# Patient Record
Sex: Female | Born: 2000 | Race: Black or African American | Hispanic: No | Marital: Single | Smoking: Never smoker
Health system: Southern US, Community
[De-identification: ages and names within clinical notes are randomized; demographics above are authoritative.]

## PROBLEM LIST (undated history)

## (undated) HISTORY — PX: CLOSED REDUCTION WRIST FRACTURE: SHX1091

---
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
## 2022-10-16 ENCOUNTER — Encounter: Payer: Self-pay | Admitting: Sports Medicine

## 2022-10-16 NOTE — Progress Notes (Signed)
  [employer] Training Room Note  History of Present Illness:   Helen [employer] is a 22 y.o. female who presents for depression. She is a Nature conservation officer.  She has dealt with depressive thoughts as well as some anxiety for the last few years.  Noticed this to her incoming freshman year at [employer].  She has met with a psychologist before, but he had the start of this incoming school year she did re-establish with [employer], our sports psychologist - does note she is experiencing some depersonalization. Helen [employer] is reporting depression with more anhedonia, low energy and lack of interest.  Does deal with some anxiety as well.  Her appetite has been stable.  Her sleep will vary, she denies any daytime naps.  She has never been on medication in the past but is open to this.  She denies any SI/HI.  Also reporting some midline stomach pain, concern for possible hernia.  Has noticed it for the last year.  No bulging, she does have episodes of constipation weekly.  At times she will have mild blood in the stool.  Sometimes stool will be firm, tries to avoid straining if possible.  Notable family history: -Mother with depression -Sister with schizophrenia  Assessment and Plan:   Depression Anxiety   Has bothered her on and off for years but fluctuates, here at the start of this year symptoms have been worsening.  She is following with psychology for CBT/counseling.  We discussed social and lifestyle intervention.  I did discuss medication options which would include but not limited to Lexapro, Celexa, Zoloft.  She will read and do some research on these.  I would like to bring her into the office formally for a PHQ-9 and GAD-7 for true diagnosis and severity.  At that visit if she would like to proceed with a trial of pharmacologic management we could consider.  3. Abdominal pain with constipation -she was concerned about possible hernia, but I do not appreciate any hernia noted on exam.  Discussed appropriate  dietary modifications.  Would recommend magnesium 400mg   daily. Recommend following with [employer] dietician to discuss dietary options. Recommended core stability rehab. Will f/u in about 1 month to re-evaluate. Could consider GI referral.  Comprehensive Musculoskeletal Exam:    Gen: Well-appearing, in no acute distress Pysch: Alert, oriented, appropriate emotional affect GI: There is some mild TTP in the midline of the stomach superior to the umbilicus.  Abdomen is soft without rigidity, no rebound or guarding.  Imaging:    N/a  Madelyn Brunner, DO Primary Care Sports Medicine - [facility] Team Physician [facility]  This document was dictated using Dragon voice recognition software. A reasonable attempt at proof reading has been made to minimize errors.

## 2022-10-28 ENCOUNTER — Encounter: Payer: Self-pay | Admitting: Sports Medicine

## 2022-10-28 ENCOUNTER — Ambulatory Visit (INDEPENDENT_AMBULATORY_CARE_PROVIDER_SITE_OTHER): Payer: BC Managed Care – PPO | Admitting: Sports Medicine

## 2022-10-28 VITALS — BP 119/78 | HR 73

## 2022-10-28 DIAGNOSIS — F322 Major depressive disorder, single episode, severe without psychotic features: Secondary | ICD-10-CM | POA: Diagnosis not present

## 2022-10-28 DIAGNOSIS — F411 Generalized anxiety disorder: Secondary | ICD-10-CM | POA: Diagnosis not present

## 2022-10-28 MED ORDER — ESCITALOPRAM OXALATE 10 MG PO TABS: 10.0000 mg | ORAL_TABLET | Freq: Every day | ORAL | 0 refills | Status: DC

## 2022-10-28 NOTE — Progress Notes (Signed)
Helen Howe - 22 y.o. female MRN [id number]  Date of birth: 2001/01/05  Office Visit Note: Visit Date: 10/28/2022 PCP: [facility] Referred by: Pa, [facility]*  Subjective: Chief Complaint  Patient presents with   Mood consult   HPI: Helen Howe is a pleasant 22 y.o. female who presents today for evaluation of depression and anxiety.  She is a Chief Technology Officer.  She has dealt with depressive thoughts as well as generalized anxiety for the last few years.  This worsened since her incoming freshman year at [employer].  In the past she had seen her sports psychologist, Helen [employer], recently has reestablished with her.  She is seeing her weekly at this time.  Currently working on charting her feelings, emotions, situations.  She has had passive SI thoughts in the past, but never had any sort of plan or desire to act out on these.  She skips breakfast which is typical for her, otherwise her appetite has been stable.  Her sleep varies, has difficulty falling asleep and staying asleep, this will wax and wane with how her anxiety and depression is doing.  Never been on medication before.  Her mother has been diagnosed with depression as well and she is managed on Lexapro.  Notable family history: -Mother with depression -Sister with schizophrenia  Questionnaire scoring (see scanned in sheet) - PHQ-9 scoring: 20 (sev) - GAD-7 scoring: 19 (sev)  Pertinent ROS were reviewed with the patient and found to be negative unless otherwise specified above in HPI.   Assessment & Plan: Visit Diagnoses:  1. Current severe episode of major depressive disorder without psychotic features, unspecified whether recurrent (HCC)   2. Generalized anxiety disorder    Plan: Discussed with [employer] she is dealing with a recent exacerbation of her chronic underlying depression and generalized anxiety disorder.  She has had issues with this for a few years but has been worsening with  returning to school.  She is following with our sports psychologist, I would like her to continue these appointments weekly and working on lifestyle interventions to help balance her mood.  Given her severity, we mutually agreed to move forward with pharmacological management.  Will start her on Lexapro 10 mg to be taken once daily.  Reviewed R/B/I.  She knows if she has any thoughts of SI/HI, she is not to notify myself, her athletic trainers or [place] immediately.  I would like to see her in the training room in about 1 month to reevaluate how things are doing.  She will continue her counseling until that time.  Follow-up: Return in about 1 month (around 11/27/2022) for in training room [employer].   Meds & Orders:  Meds ordered this encounter  Medications   escitalopram (LEXAPRO) 10 MG tablet    Sig: Take 1 tablet (10 mg total) by mouth daily.    Dispense:  60 tablet    Refill:  0   No orders of the defined types were placed in this encounter.    Procedures: No procedures performed      Clinical History: No specialty comments available.  She reports that she has never smoked. She has never used smokeless tobacco. No results for input(s): "HGBA1C", "LABURIC" in the last 8760 hours.  Objective:   Vital Signs: BP 119/78 (BP Location: Right Arm, Patient Position: Sitting)   Pulse 73   Physical Exam  Gen: Well-appearing, in no acute distress; non-toxic CV: Regular Rate. Well-perfused. Warm. Normal S1, S2. Resp: Breathing  unlabored on room air; no wheezing. Psych: Fluid speech in conversation; appropriate affect; normal thought process Neuro: Sensation intact throughout. No gross coordination deficits.   Imaging: No results found.  Past Medical/Family/Surgical/Social History: Medications & Allergies reviewed per EMR, new medications updated. There are no problems to display for this patient.  No past medical history on file. Family History  Problem Relation Age of Onset   Depression  Mother    Schizophrenia Sister    Past Surgical History:  Procedure Laterality Date   CAST APPLICATION Right 05/30/2015   Procedure: CAST APPLICATION;  Surgeon: Erin Sons, MD;  Location: [facility] ORS;  Service: Orthopedics;  Laterality: Right;   CLOSED REDUCTION WRIST FRACTURE Right 05/30/2015   Procedure: CLOSED REDUCTION WRIST / MANIPULATION;  Surgeon: Erin Sons, MD;  Location: [facility] ORS;  Service: Orthopedics;  Laterality: Right;   CLOSED REDUCTION WRIST FRACTURE     Social History   Occupational History   Not on file  Tobacco Use   Smoking status: Never   Smokeless tobacco: Never  Substance and Sexual Activity   Alcohol use: No   Drug use: No   Sexual activity: Not on file

## 2022-12-24 ENCOUNTER — Other Ambulatory Visit: Payer: Self-pay | Admitting: Sports Medicine

## 2022-12-24 DIAGNOSIS — F411 Generalized anxiety disorder: Secondary | ICD-10-CM

## 2022-12-24 DIAGNOSIS — F322 Major depressive disorder, single episode, severe without psychotic features: Secondary | ICD-10-CM

## 2022-12-25 ENCOUNTER — Encounter: Payer: Self-pay | Admitting: Sports Medicine

## 2022-12-25 ENCOUNTER — Other Ambulatory Visit: Payer: Self-pay | Admitting: Sports Medicine

## 2022-12-25 DIAGNOSIS — F411 Generalized anxiety disorder: Secondary | ICD-10-CM

## 2022-12-25 DIAGNOSIS — F322 Major depressive disorder, single episode, severe without psychotic features: Secondary | ICD-10-CM

## 2022-12-25 MED ORDER — ESCITALOPRAM OXALATE 20 MG PO TABS: 20.0000 mg | ORAL_TABLET | Freq: Every day | ORAL | 0 refills | Status: DC

## 2022-12-25 NOTE — Progress Notes (Signed)
  [employer] Training Room Note  History of Present Illness:   Helen Howe is a 22 y.o. female who is following up for depression and anxiety.  She is a Chief Technology Officer.  Last seen back in September when we started her on Lexapro 10 mg.  She did pick this up and begin this around the end of September.  She has been taking this once daily and has not noted much change at this time.  She was supposed to meet with our sports psychologist, Sharlotte Alamo but she has not had any appointments with her since our last appointment.  On average she feels like her mood is a 4/10.  She has had history of passive SI thoughts in the past as well as current.  About 1-2 weeks ago she did have thoughts of jumping out of her window and was considering doing so, but her boyfriend stopped her from this.  She does feel safe at this time at home, does not desire hospitalization currently.    Assessment and Plan:   MDD, severe Generalized anxiety disorder  Both of the above are not at goal.  Did have an SI occurrence in the past week.  She unfortunately has not been following up with our sports psychologist, but is agreeable to do so.  She will check in with her ATC, Siobhan tomorrow, to see how she is doing and confirm f/u appt is set with Candise Bowens early next week (did discuss on phone with her today). Described the serious nature of consistent care and f/u. We will increase Lexapro from 10 mg --> 20mg  every day. Next Rx sent today.  She will follow-up with me about 1 month from increasing her Lexapro dose.  Comprehensive Musculoskeletal Exam:    Gen: Well-appearing, in no acute distress Psych: Slightly blunted affect; appropriate in conversation  Imaging:    N/a  Madelyn Brunner, DO Primary Care Sports Medicine - [facility] Team Physician [facility]  This document was dictated using Dragon voice recognition software. A reasonable attempt at proof reading has been made to minimize errors.

## 2023-02-24 ENCOUNTER — Other Ambulatory Visit: Payer: Self-pay | Admitting: Sports Medicine

## 2023-02-24 DIAGNOSIS — F411 Generalized anxiety disorder: Secondary | ICD-10-CM

## 2023-02-24 DIAGNOSIS — F322 Major depressive disorder, single episode, severe without psychotic features: Secondary | ICD-10-CM

## 2023-02-26 ENCOUNTER — Encounter: Payer: Self-pay | Admitting: Sports Medicine

## 2023-02-26 ENCOUNTER — Other Ambulatory Visit: Payer: Self-pay | Admitting: Sports Medicine

## 2023-02-26 DIAGNOSIS — F411 Generalized anxiety disorder: Secondary | ICD-10-CM

## 2023-02-26 DIAGNOSIS — F322 Major depressive disorder, single episode, severe without psychotic features: Secondary | ICD-10-CM

## 2023-02-26 MED ORDER — ESCITALOPRAM OXALATE 20 MG PO TABS: 20.0000 mg | ORAL_TABLET | Freq: Every day | ORAL | 1 refills | Status: DC

## 2023-02-26 NOTE — Progress Notes (Signed)
  [employer] Training Room Note  History of Present Illness:   Helen Howe is a 23 y.o. female who presents for f/u of MDD and GAD. She is a Chief Technology Officer.  Lucca reports her mood has been much better.  Feels like her depression and anxiety is much better controlled.  She was at home over break which was helpful.  She is currently not draining but is working to get back into training and other physical activity.  She has been on Lexapro 20 mg daily and finds this to be very beneficial.  She is following up with Sharlotte Alamo with a plan to meet once every 2 weeks for counseling.  No SI/HI.  Assessment and Plan:   MDD - severe in nature but improving and controlled. Continue Lexapro 20mg  every day and counseling q 2 weeks. GAD - controlled. Keep above plan.  - Refill Lexapro today Comprehensive Musculoskeletal Exam:    Gen: Well-appearing, in no acute distress Psych: Improved affect; positive demeanor; appropriate in conversation  Imaging:    N/a  Madelyn Brunner, DO Primary Care Sports Medicine - [facility] Team Physician [facility]  This document was dictated using Dragon voice recognition software. A reasonable attempt at proof reading has been made to minimize errors.

## 2023-04-14 ENCOUNTER — Other Ambulatory Visit: Payer: Self-pay | Admitting: Sports Medicine

## 2023-04-14 DIAGNOSIS — F411 Generalized anxiety disorder: Secondary | ICD-10-CM

## 2023-04-14 DIAGNOSIS — F322 Major depressive disorder, single episode, severe without psychotic features: Secondary | ICD-10-CM

## 2023-04-14 MED ORDER — ESCITALOPRAM OXALATE 20 MG PO TABS: 20.0000 mg | ORAL_TABLET | Freq: Every day | ORAL | 1 refills | Status: DC

## 2023-04-27 ENCOUNTER — Inpatient Hospital Stay (HOSPITAL_COMMUNITY)
Admission: AD | Admit: 2023-04-27 | Discharge: 2023-05-01 | DRG: 885 | Disposition: A | Discharge status: Home / Self Care | Source: Intra-hospital | Attending: Psychiatry | Admitting: Psychiatry

## 2023-04-27 ENCOUNTER — Ambulatory Visit (HOSPITAL_COMMUNITY)
Admission: EM | Admit: 2023-04-27 | Discharge: 2023-04-27 | Disposition: A | Discharge status: Other Acute Inpatient Hospital | Attending: Psychiatry | Admitting: Psychiatry

## 2023-04-27 DIAGNOSIS — F410 Panic disorder [episodic paroxysmal anxiety] without agoraphobia: Secondary | ICD-10-CM | POA: Insufficient documentation

## 2023-04-27 DIAGNOSIS — Z3202 Encounter for pregnancy test, result negative: Secondary | ICD-10-CM | POA: Diagnosis not present

## 2023-04-27 DIAGNOSIS — F332 Major depressive disorder, recurrent severe without psychotic features: Principal | ICD-10-CM | POA: Diagnosis present

## 2023-04-27 DIAGNOSIS — G4700 Insomnia, unspecified: Secondary | ICD-10-CM | POA: Diagnosis present

## 2023-04-27 DIAGNOSIS — R45851 Suicidal ideations: Secondary | ICD-10-CM | POA: Diagnosis present

## 2023-04-27 DIAGNOSIS — Z818 Family history of other mental and behavioral disorders: Secondary | ICD-10-CM | POA: Diagnosis not present

## 2023-04-27 DIAGNOSIS — Z79899 Other long term (current) drug therapy: Secondary | ICD-10-CM | POA: Diagnosis not present

## 2023-04-27 DIAGNOSIS — Z560 Unemployment, unspecified: Secondary | ICD-10-CM | POA: Diagnosis not present

## 2023-04-27 DIAGNOSIS — F411 Generalized anxiety disorder: Secondary | ICD-10-CM | POA: Diagnosis present

## 2023-04-27 DIAGNOSIS — G4710 Hypersomnia, unspecified: Secondary | ICD-10-CM | POA: Diagnosis present

## 2023-04-27 DIAGNOSIS — Z9151 Personal history of suicidal behavior: Secondary | ICD-10-CM | POA: Diagnosis not present

## 2023-04-27 LAB — POCT URINE DRUG SCREEN - MANUAL ENTRY (I-SCREEN)
POC Amphetamine UR: NOT DETECTED
POC Buprenorphine (BUP): NOT DETECTED
POC Cocaine UR: NOT DETECTED
POC Marijuana UR: POSITIVE — AB
POC Methadone UR: NOT DETECTED
POC Methamphetamine UR: NOT DETECTED
POC Morphine: NOT DETECTED
POC Oxazepam (BZO): NOT DETECTED
POC Oxycodone UR: NOT DETECTED
POC Secobarbital (BAR): NOT DETECTED

## 2023-04-27 LAB — CBC WITH DIFFERENTIAL/PLATELET
Abs Immature Granulocytes: 0.03 10*3/uL (ref 0.00–0.07)
Basophils Absolute: 0.1 10*3/uL (ref 0.0–0.1)
Basophils Relative: 1 %
Eosinophils Absolute: 0.3 10*3/uL (ref 0.0–0.5)
Eosinophils Relative: 3 %
HCT: 38.9 % (ref 36.0–46.0)
Hemoglobin: 12.6 g/dL (ref 12.0–15.0)
Immature Granulocytes: 0 %
Lymphocytes Relative: 22 %
Lymphs Abs: 1.6 10*3/uL (ref 0.7–4.0)
MCH: 26.9 pg (ref 26.0–34.0)
MCHC: 32.4 g/dL (ref 30.0–36.0)
MCV: 83.1 fL (ref 80.0–100.0)
Monocytes Absolute: 0.4 10*3/uL (ref 0.1–1.0)
Monocytes Relative: 5 %
Neutro Abs: 5.1 10*3/uL (ref 1.7–7.7)
Neutrophils Relative %: 69 %
Platelets: 292 10*3/uL (ref 150–400)
RBC: 4.68 MIL/uL (ref 3.87–5.11)
RDW: 12.2 % (ref 11.5–15.5)
WBC: 7.4 10*3/uL (ref 4.0–10.5)
nRBC: 0 % (ref 0.0–0.2)

## 2023-04-27 LAB — COMPREHENSIVE METABOLIC PANEL
ALT: 17 U/L (ref 0–44)
AST: 23 U/L (ref 15–41)
Albumin: 3.8 g/dL (ref 3.5–5.0)
Alkaline Phosphatase: 24 U/L — ABNORMAL LOW (ref 38–126)
Anion gap: 9 (ref 5–15)
BUN: 13 mg/dL (ref 6–20)
CO2: 22 mmol/L (ref 22–32)
Calcium: 9.3 mg/dL (ref 8.9–10.3)
Chloride: 106 mmol/L (ref 98–111)
Creatinine, Ser: 0.89 mg/dL (ref 0.44–1.00)
GFR, Estimated: >60 mL/min (ref >60)
Glucose, Bld: 87 mg/dL (ref 70–99)
Potassium: 4.2 mmol/L (ref 3.5–5.1)
Sodium: 137 mmol/L (ref 135–145)
Total Bilirubin: 0.6 mg/dL (ref 0.0–1.2)
Total Protein: 7.1 g/dL (ref 6.5–8.1)

## 2023-04-27 LAB — LIPID PANEL
Cholesterol: 142 mg/dL (ref 0–200)
HDL: 55 mg/dL (ref >40)
LDL Cholesterol: 77 mg/dL (ref 0–99)
Total CHOL/HDL Ratio: 2.6 ratio
Triglycerides: 50 mg/dL (ref <150)
VLDL: 10 mg/dL (ref 0–40)

## 2023-04-27 LAB — HEMOGLOBIN A1C
Hgb A1c MFr Bld: 5.2 % (ref 4.8–5.6)
Mean Plasma Glucose: 102.54 mg/dL

## 2023-04-27 LAB — ETHANOL: Alcohol, Ethyl (B): <10 mg/dL (ref <10)

## 2023-04-27 MED ORDER — DIPHENHYDRAMINE HCL 50 MG PO CAPS: 50.0000 mg | ORAL_CAPSULE | Freq: Three times a day (TID) | ORAL | Status: DC | PRN

## 2023-04-27 MED ORDER — TRAZODONE HCL 50 MG PO TABS
50.0000 mg | ORAL_TABLET | Freq: Every evening | ORAL | Status: DC | PRN
  Administered 2023-04-27 – 2023-04-30 (×4): 50 mg via ORAL
  Filled 2023-04-27 (×5): qty 1

## 2023-04-27 MED ORDER — HALOPERIDOL 5 MG PO TABS: 5.0000 mg | ORAL_TABLET | Freq: Three times a day (TID) | ORAL | Status: DC | PRN

## 2023-04-27 MED ORDER — DIPHENHYDRAMINE HCL 25 MG PO CAPS: 50.0000 mg | ORAL_CAPSULE | Freq: Three times a day (TID) | ORAL | Status: DC | PRN

## 2023-04-27 MED ORDER — MAGNESIUM HYDROXIDE 400 MG/5ML PO SUSP: 30.0000 mL | Freq: Every day | ORAL | Status: DC | PRN

## 2023-04-27 MED ORDER — ESCITALOPRAM OXALATE 10 MG PO TABS
20.0000 mg | ORAL_TABLET | Freq: Every day | ORAL | Status: DC
Start: 2023-04-27 — End: 2023-04-27
  Administered 2023-04-27: 20 mg via ORAL
  Filled 2023-04-27: qty 2

## 2023-04-27 MED ORDER — BUPROPION HCL ER (XL) 150 MG PO TB24: 150.0000 mg | ORAL_TABLET | Freq: Every day | ORAL | Status: DC

## 2023-04-27 MED ORDER — ACETAMINOPHEN 325 MG PO TABS
650.0000 mg | ORAL_TABLET | Freq: Four times a day (QID) | ORAL | Status: DC | PRN
  Administered 2023-04-28: 650 mg via ORAL
  Filled 2023-04-27: qty 2

## 2023-04-27 MED ORDER — HYDROXYZINE HCL 25 MG PO TABS
25.0000 mg | ORAL_TABLET | Freq: Three times a day (TID) | ORAL | Status: DC | PRN
  Administered 2023-04-27 – 2023-04-30 (×4): 25 mg via ORAL
  Filled 2023-04-27 (×5): qty 1

## 2023-04-27 MED ORDER — ACETAMINOPHEN 325 MG PO TABS: 650.0000 mg | ORAL_TABLET | Freq: Four times a day (QID) | ORAL | Status: DC | PRN

## 2023-04-27 MED ORDER — ESCITALOPRAM OXALATE 20 MG PO TABS
20.0000 mg | ORAL_TABLET | Freq: Every day | ORAL | Status: DC
Start: 2023-04-28 — End: 2023-04-28
  Administered 2023-04-28: 20 mg via ORAL
  Filled 2023-04-27 (×2): qty 1

## 2023-04-27 MED ORDER — TRAZODONE HCL 50 MG PO TABS: 50.0000 mg | ORAL_TABLET | Freq: Every evening | ORAL | Status: DC | PRN

## 2023-04-27 MED ORDER — BUPROPION HCL ER (XL) 150 MG PO TB24
150.0000 mg | ORAL_TABLET | Freq: Every day | ORAL | Status: DC
  Administered 2023-04-28 – 2023-04-29 (×2): 150 mg via ORAL
  Filled 2023-04-27 (×3): qty 1

## 2023-04-27 MED ORDER — HYDROXYZINE HCL 25 MG PO TABS: 25.0000 mg | ORAL_TABLET | Freq: Three times a day (TID) | ORAL | Status: DC | PRN

## 2023-04-27 MED ORDER — ALUM & MAG HYDROXIDE-SIMETH 200-200-20 MG/5ML PO SUSP: 30.0000 mL | ORAL | Status: DC | PRN

## 2023-04-27 NOTE — Discharge Instructions (Signed)
 [facility]

## 2023-04-27 NOTE — ED Notes (Signed)
 Patient admitted to Adult Observation for worsening depression and passive suicidal ideation. When asked, pt endorses SI with no plan, denies HI and AVH. She denies physical pain or discomfort. Pt's affect is depressed, mood congruent. Pt's speech is soft. Skin check conducted by this RN and Adela Lank, MHT. Patient oriented to the unit. Dinner and beverage provided. No additional needs at this time. We will continue to monitor for safety.

## 2023-04-27 NOTE — ED Notes (Signed)
 Patient denies pain and is resting comfortably.

## 2023-04-27 NOTE — ED Notes (Signed)
 Report given to [facility] RN@[facility]  adult unit

## 2023-04-27 NOTE — ED Notes (Signed)
 Pt is calm and cooperative very withdrawn, quiet she is depressed and is ok with being transferred to [facility] for inpatient denies SI/HI/AVH no pain or distress noted will continue to monitor for safety

## 2023-04-27 NOTE — ED Provider Notes (Signed)
 [facility] Continuous Assessment Admission H&P  Date: 04/27/23 Patient Name: Helen Howe MRN: [id number] Chief Complaint: "I think it's easier to not be here."   Diagnoses:  Final diagnoses:  Severe episode of recurrent major depressive disorder, without psychotic features (HCC)  Generalized anxiety disorder    HPI: Helen Howe is a 23 yo female with a past psychiatric history of MDD on lexapro, history of 1 suicide attempt, no history of inpatient psychiatric hospitalizations and no pertinent past medical history who presents to the [facility] with her mother for increased passive suicidal ideations.  Patient is seen in the assessment room with mom Pattricia Boss) and [facility] Fal[facility]. Patient is observed to be clinging to her mom and curled up on the seat in the room. She speaks softly. She is unable to identify a trigger for her current increased suicidal ideation. Mom reports that patient told her that she was at her breaking point and thinks it's easier to not be here. She reports that this morning patient told her about the suicide attempt. Patient is vague about reference to a plan. She denies writing a suicide note or making action towards suicide. She reports she self-harmed yesterday with a knife on her wrist due to feeling overwhelmed. Patient confirms that she has been feeling increased hopelessness, suicidal ideation, guilt, decreased energy, increased sleep, decreased appetite, decreased concentration. Mom reports that patient has not been taking care of herself, reports the house has been dirty. She reports that patient normally likes running track and field and stopped last year. She has been having difficulties at school, is a Technical sales engineer at [employer]. She reports she attempted suicide a couple of months ago. Per chart review, patient had thoughts of jumping out her window in 12/2022. She reports she tried to jump from the window on the second floor but her boyfriend stopped  her. Patient also reports increased generalized anxiety. Patient denies history of trauma. She denies AVH. Patient reports that she has been prescribed lexapro 20mg  and has been compliant with her medication. She denies substance use.   Patient has a past psychiatric history of depression and anxiety. She has been followed up in the outpatient setting by Sharlotte Alamo a sports psychologist as well as Dr. Shon Baton for medication management. She is currently taking lexapro 20mg . She has not had other past medication trials. She has no prior inpatient psychiatric hospitalizations. She had the prior suicide attempt in 12/2022 when she tried to jump out of her window but her boyfriend stopped her.   Patient has no medical history or allergies. She has no history of seizures or head trauma. She has no known medication allergies.   Patient has a family history of mother with seasonal depression, older sister with schizophrenia, and maternal uncles with schizophrenia.   Patient lives in an apartment with her boyfriend of 1 year. She is currently studying sociology at [employer]. Her grades "could be better." She used to run track and field but stopped last year. She is not currently employed. She is from [place]. She does not have access to guns. She denies legal issues. Does not identify religion as an important part of her life.   Discussed restarting home lexapro 20mg  for her depression. Discussed starting wellbutrin to augment her antidepressant. Discussed r/b/se. Patient and mom were in agreement for trial of wellbutrin. Discussed recommendation for inpatient psychiatric hospitalization.   Total Time spent with patient: 30 minutes  Musculoskeletal  Strength & Muscle Tone: within normal limits Gait &  Station: normal Patient leans: N/A  Psychiatric Specialty Exam  Presentation General Appearance: Casual  Eye Contact:Poor  Speech:-- (soft)  Speech Volume:Decreased  Handedness:No data  recorded  Mood and Affect  Mood:-- (think it's easier to not be here)  Affect:Tearful; Flat   Thought Process  Thought Processes:Coherent  Descriptions of Associations:Intact  Orientation:Full (Time, Place and Person)  Thought Content:WDL    Hallucinations:Hallucinations: None  Ideas of Reference:None  Suicidal Thoughts:Suicidal Thoughts: Yes, Passive  Homicidal Thoughts:Homicidal Thoughts: No   Sensorium  Memory:Immediate Fair  Judgment:Impaired  Insight:Lacking   Executive Functions  Concentration:Fair  Attention Span:Fair  Recall:Fair  Fund of Knowledge:Fair  Language:Fair   Psychomotor Activity  Psychomotor Activity:Psychomotor Activity: Normal   Assets  Assets:Communication Skills; Desire for Improvement; Housing; Resilience; Intimacy   Sleep  Sleep:Sleep: Good   Nutritional Assessment (For OBS and [facility]BC admissions only) Has the patient had a weight loss or gain of 10 pounds or more in the last 3 months?: No Has the patient had a decrease in food intake/or appetite?: No Does the patient have dental problems?: No Does the patient have eating habits or behaviors that may be indicators of an eating disorder including binging or inducing vomiting?: No Has the patient recently lost weight without trying?: 0 Has the patient been eating poorly because of a decreased appetite?: 0 Malnutrition Screening Tool Score: 0    Physical Exam ROS  Physical Exam Constitutional:      Appearance: the patient is not toxic-appearing.  Pulmonary:     Effort: Pulmonary effort is normal.  Neurological:     General: No focal deficit present.     Mental Status: the patient is alert and oriented to person, place, and time.   Review of Systems  Respiratory:  Negative for shortness of breath.   Cardiovascular:  Negative for chest pain.  Gastrointestinal:  Negative for abdominal pain, constipation, diarrhea, nausea and vomiting.  Neurological:  Negative for  headaches.    Blood pressure 127/89, pulse 71, temperature 98.3 F (36.8 C), temperature source Oral, resp. rate 16, SpO2 100%. There is no height or weight on file to calculate BMI.  Past Psychiatric History:  Patient has a past psychiatric history of depression and anxiety. She has been followed up in the outpatient setting by Sharlotte Alamo a sports psychologist as well as Dr. Shon Baton for medication management. She is currently taking lexapro 20mg . She has not had other past medication trials. She has no prior inpatient psychiatric hospitalizations. She had the prior suicide attempt in 12/2022 when she tried to jump out of her window but her boyfriend stopped her.    Is the patient at risk to self? Yes  Has the patient been a risk to self in the past 6 months? Yes .    Has the patient been a risk to self within the distant past? No   Is the patient a risk to others? No   Has the patient been a risk to others in the past 6 months? No   Has the patient been a risk to others within the distant past? No   Past Medical History: none  Family History: Patient has a family history of mother with seasonal depression, older sister with schizophrenia, and maternal uncles with schizophrenia.   Social History: Patient lives in an apartment with her boyfriend of 1 year. She is currently studying sociology at [employer]. Her grades "could be better." She used to run track and field but stopped last year. She is not  currently employed. She is from [place]. She does not have access to guns. She denies legal issues. Does not identify religion as an important part of her life.   Last Labs:  No visits with results within 6 Month(s) from this visit.  Latest known visit with results is:  Lab on 02/09/2019  Component Date Value Ref Range Status   SARS-CoV-2, NAA 02/09/2019 Not Detected  Not Detected Final   Comment: This nucleic acid amplification test was developed and its performance characteristics determined  by [employer]. Nucleic acid amplification tests include PCR and TMA. This test has not been FDA cleared or approved. This test has been authorized by FDA under an Emergency Use Authorization (EUA). This test is only authorized for the duration of time the declaration that circumstances exist justifying the authorization of the emergency use of in vitro diagnostic tests for detection of SARS-CoV-2 virus and/or diagnosis of COVID-19 infection under section 564(b)(1) of the Act, 21 U.S.C. 161WRU-0(A) (1), unless the authorization is terminated or revoked sooner. When diagnostic testing is negative, the possibility of a false negative result should be considered in the context of a patient's recent exposures and the presence of clinical signs and symptoms consistent with COVID-19. An individual without symptoms of COVID-19 and who is not shedding SARS-CoV-2 virus would                           expect to have a negative (not detected) result in this assay.     Allergies: Patient has no known allergies.  Medications:  Facility Ordered Medications  Medication   acetaminophen (TYLENOL) tablet 650 mg   alum & mag hydroxide-simeth (MAALOX/MYLANTA) 200-200-20 MG/5ML suspension 30 mL   magnesium hydroxide (MILK OF MAGNESIA) suspension 30 mL   haloperidol (HALDOL) tablet 5 mg   And   diphenhydrAMINE (BENADRYL) capsule 50 mg   hydrOXYzine (ATARAX) tablet 25 mg   traZODone (DESYREL) tablet 50 mg   escitalopram (LEXAPRO) tablet 20 mg   [START ON 04/28/2023] buPROPion (WELLBUTRIN XL) 24 hr tablet 150 mg   PTA Medications  Medication Sig   predniSONE (STERAPRED UNI-PAK 21 TAB) 10 MG (21) TBPK tablet Use as directed   benzonatate (TESSALON PERLES) 100 MG capsule Take 1 capsule (100 mg total) by mouth 3 (three) times daily as needed.   escitalopram (LEXAPRO) 20 MG tablet Take 1 tablet (20 mg total) by mouth daily.    Medical Decision Making  Cathy Crounse is a 23 yo female with a past  psychiatric history of MDD on lexapro, history of 1 suicide attempt, no history of inpatient psychiatric hospitalizations and no pertinent past medical history who presents to the [facility] with her mother for increased passive suicidal ideations. Patient appears child-like, guarded, unable to identify triggers and has had increased depressive symptoms in the setting of being followed up with outpatient therapy. Patient has also had recent self-harm (yesterday) and recent suicide attempt. Note from outpatient medication management provider in 12/2022 mentions that patient had SA and had thought about hospitalization "She has had history of passive SI thoughts in the past as well as current.  About 1-2 weeks ago she did have thoughts of jumping out of her window and was considering doing so, but her boyfriend stopped her from this.  She does feel safe at this time at home, does not desire hospitalization currently."   Given patient's presentation at this time, we will recommend inpatient psychiatric hospitalization. We will restart  patient's home lexapro and plan to augment with wellbutrin given her reported hypersomnia.   #MDD, recurrent, severe -restart home lexapro 20mg  for depression -start wellbutrin XL 150mg  (tomorrow AM) for depression  Meds ordered this encounter  Medications   acetaminophen (TYLENOL) tablet 650 mg   alum & mag hydroxide-simeth (MAALOX/MYLANTA) 200-200-20 MG/5ML suspension 30 mL   magnesium hydroxide (MILK OF MAGNESIA) suspension 30 mL   AND Linked Order Group    haloperidol (HALDOL) tablet 5 mg    diphenhydrAMINE (BENADRYL) capsule 50 mg   hydrOXYzine (ATARAX) tablet 25 mg   traZODone (DESYREL) tablet 50 mg   escitalopram (LEXAPRO) tablet 20 mg   buPROPion (WELLBUTRIN XL) 24 hr tablet 150 mg    Orders Placed This Encounter  Procedures   CBC with Differential/Platelet   Comprehensive metabolic panel   Hemoglobin A1c   Ethanol   Lipid panel   TSH   Diet regular Room  service appropriate? Yes; Fluid consistency: Thin   Voluntary Treatment   Vital signs   Activity as tolerated   Full code   POC urine preg, ED   POCT Urine Drug Screen - (I-Screen)   Place in continuous assessment [facility]    Dispo: recommend inpatient psychiatric hospitalization   This case was discussed with attending Dr. Enedina Finner who agrees with the above formulated treatment plan. Please see attending attestation for additional details.   Recommendations  Based on my evaluation the patient does not appear to have an emergency medical condition.  Karie Fetch, MD, PGY-2 04/27/23  4:44 PM

## 2023-04-27 NOTE — Progress Notes (Signed)
   04/27/23 1612  [facility] Triage Screening (Walk-ins at [facility] only)  How Did You Hear About [place]? Self  What Is the Reason for Your Visit/Call Today? Patient is a 23 year old that presents this date as a voluntary walk in with her mother [name] with patient voicing ongoing SI for the last 24 hours. Patient is vague in reference to a plan and renders limited history. Patient is observed to be tearful and speaks in a low soft voice that is difficult to understand. Mother is present who renders most of patient's history. Patient was diagnosed with depression in the last year and per chart review was seen on 3/4 by Madelyn Brunner DO who is prescribing Lexapro 20 mg and patient reports current compliance although states, "it isn't working." Patient reports ongoing feelings of hopelessness and worthlessness. Patient cannot identify any immediate psycho-social stressors or why her symptoms have increased in the last month. Patient denies any HI or AVH. Patient denies any SA issues. 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     40 mins [facility] [place] [address] [place]  How Long Has This Been Causing You Problems? <Week  Have You Recently Had Any Thoughts About Hurting Yourself? Yes  How long ago did you have thoughts about hurting yourself? last 24 hours  Are You Planning to Commit Suicide/Harm Yourself At This time? No  Have you Recently Had Thoughts About Hurting Someone [name]? No  Are You Planning To Harm Someone At This Time? No  Physical Abuse Denies  Verbal Abuse Denies  Sexual Abuse Denies  Exploitation of patient/patient's resources Denies  Self-Neglect Denies  Possible abuse reported to: Other (Comment) (NA)  Are you currently experiencing any auditory, visual or other hallucinations? No  Have You Used Any Alcohol or Drugs in the Past 24 Hours? No  Do you have any current medical co-morbidities that require immediate attention? No  Clinician description of patient physical  appearance/behavior: Patient is cooperative  What Do You Feel Would Help You the Most Today? Medication(s)  If access to [facility] was not available, would you have sought care in the Emergency Department? No  Determination of Need Urgent (48 hours)  Options For Referral  (to be determined)  Determination of Need filed? Yes

## 2023-04-27 NOTE — BH Assessment (Addendum)
 LCSW Social Work Progress NoteMayzee Howe  MRN: [id number]  04/27/2023 5:33 PM  Per Karie Fetch, MD, patient meets criteria for inpatient psychiatric treatment. The accepting is Dr. Rex Kras, MD [facility], Danika, RN, assigned patient to [facility], room 402-2 tonight pending labs, vol, UDS, EKG. Patient's care team provided disposition updates.

## 2023-04-27 NOTE — BH Assessment (Signed)
 Comprehensive Clinical Assessment (CCA) Note  04/27/2023 Helen Howe [id number]  Chief Complaint:  Chief Complaint  Patient presents with   Depression   Suicidal   Visit Diagnosis:  Severe episode of recurrent major depressive disorder, without psychotic features (HCC)  Generalized anxiety disorder   The patient demonstrates the following risk factors for suicide: Chronic risk factors for suicide include: psychiatric disorder of DEPRESSION . Acute risk factors for suicide include: social withdrawal/isolation. Protective factors for this patient include: positive social support, positive therapeutic relationship, responsibility to others (children, family), and hope for the future. Considering these factors, the overall suicide risk at this point appears to be high. Patient is not appropriate for outpatient follow up.     CCA Screening, Triage and Referral (STR)  Patient Reported Information How did you hear about [place]? Self  What Is the Reason for Your Visit/Call Today? Patient is a 23 year old that presents this date as a voluntary walk in with her mother Helen Howe with patient voicing ongoing SI for the last 24 hours. Patient is vague in reference to a plan and renders limited history. Patient is observed to be tearful and speaks in a low soft voice that is difficult to understand. Mother is present who renders most of patient's history. Patient was diagnosed with depression in the last year and per chart review was seen on 3/4 by Helen Brunner DO who is prescribing Lexapro 20 mg and patient reports current compliance although states, "it isn't working." Patient reports ongoing feelings of hopelessness and worthlessness. Patient cannot identify any immediate psycho-social stressors or why her symptoms have increased in the last month. Patient denies any HI or AVH. Patient denies any SA issues. 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       Per mom for the past 6-7  months patient has been having a hard time functioning at school and feeling motivated. Mom reports that patient has reported feeling overwhelmed with daily task of schoolwork. Mom reports noticing decrease in patient functioning, stating that her house was not organized, and she has not been able to participate in track and field at school. Mom states "she has no will to do anything". A couple weeks ago mom found patient in her closet on the floor and patient recently told mom that she attempted to jump out of her apartment window, but her boyfriend came in and stopped her. Patient lives on the second floor. Mom reports that patient called her this morning and told her that she could no longer do it and did not want to be alive.   Patient denies history of inpatient treatment and does not use drugs. Patient reports social alcohol use. Patient does not have access to a gun, she lives with her boyfriend of one year. Patient reports last year was a difficult year for her but denies being on academic probation or being at risk of failing school. Patient denies history of P/S/E abuse and no recent trauma. Mom is diagnosed with seasonal depression and her older sister is diagnosed with schizophrenia. Patient denies legal issues, does not have access to guns and she is not working.  Patient oriented to person, place and situation. Patient eye contact is normal, speech is soft and almost childlike. Patient reports ongoing SI no plan. Patient reports depressive symptoms of sadness, low mood and energy, crying, feeling helpless and hopeless and difficulty sleeping. Patient also reports engaging in SIB of cutting her wrist with a knife  yesterday, however there is no visible signs of cutting.   How Long Has This Been Causing You Problems? <Week  What Do You Feel Would Help You the Most Today? Medication(s)   Have You Recently Had Any Thoughts About Hurting Yourself? Yes  Are You Planning to Commit Suicide/Harm  Yourself At This time? No   Flowsheet Row ED from 04/27/2023 in [facility]  C-SSRS RISK CATEGORY Moderate Risk       Have you Recently Had Thoughts About Hurting Someone Helen Howe? No  Are You Planning to Harm Someone at This Time? No  Explanation: No data recorded  Have You Used Any Alcohol or Drugs in the Past 24 Hours? No  How Long Ago Did You Use Drugs or Alcohol? No data recorded What Did You Use and How Much? No data recorded  Do You Currently Have a Therapist/Psychiatrist? Yes  Name of Therapist/Psychiatrist: Name of Therapist/Psychiatrist: AT [employer]   Have You Been Recently Discharged From Any Office Practice or Programs? No  Explanation of Discharge From Practice/Program: NA    CCA Screening Triage Referral Assessment Type of Contact: Face-to-Face  Telemedicine Service Delivery:   Is this Initial or Reassessment?   Date Telepsych consult ordered in [facility]:    Time Telepsych consult ordered in [facility]:    Location of Assessment: [facility]  Provider Location: [facility] Assessment Services   Collateral Involvement: MOTHER   Does Patient Have a Automotive engineer Guardian? No data recorded Legal Guardian Contact Information: NA  Copy of Legal Guardianship Form: -- (NA)  Legal Guardian Notified of Arrival: -- (NA)  Legal Guardian Notified of Pending Discharge: -- (NA)  If Minor and Not Living with Parent(s), Who has Custody? NA  Is CPS involved or ever been involved? Never  Is APS involved or ever been involved? Never   Patient Determined To Be At Risk for Harm To Self or Others Based on Review of Patient Reported Information or Presenting Complaint? Yes, for Self-Harm  Method: No Plan  Availability of Means: No access or NA  Intent: Vague intent or NA  Notification Required: No need or identified person  Additional Information for Danger to Others Potential: Previous attempts  Additional Comments for Danger  to Others Potential: NA  Are There Guns or Other Weapons in Your Home? No  Types of Guns/Weapons: NA  Are These Weapons Safely Secured?                            -- (NA)  Who Could Verify You Are Able To Have These Secured: BOYFRIEND AND MOTHER  Do You Have any Outstanding Charges, Pending Court Dates, Parole/Probation? DENIES  Contacted To Inform of Risk of Harm To Self or Others: Family/Significant Other:    Does Patient Present under Involuntary Commitment? No    [place] of Residence: [place]   Patient Currently Receiving the Following Services: Individual Therapy; Medication Management   Determination of Need: Urgent (48 hours)   Options For Referral: Inpatient Hospitalization (to be determined)     CCA Biopsychosocial Patient Reported Schizophrenia/Schizoaffective Diagnosis in Past: No   Strengths: NA   Mental Health Symptoms Depression:  Change in energy/activity; Difficulty Concentrating; Fatigue; Hopelessness; Increase/decrease in appetite; Worthlessness; Tearfulness; Irritability   Duration of Depressive symptoms: Duration of Depressive Symptoms: Greater than two weeks   Mania:  None   Anxiety:   Worrying; Tension   Psychosis:  None   Duration  of Psychotic symptoms:    Trauma:  None   Obsessions:  None   Compulsions:  None   Inattention:  None   Hyperactivity/Impulsivity:  None   Oppositional/Defiant Behaviors:  None   Emotional Irregularity:  None   Other Mood/Personality Symptoms:  NA    Mental Status Exam Appearance and self-care  Stature:  Average   Weight:  Average weight   Clothing:  Age-appropriate   Grooming:  Normal   Cosmetic use:  None   Posture/gait:  Normal   Motor activity:  Slowed   Sensorium  Attention:  Normal   Concentration:  Normal   Orientation:  X5   Recall/memory:  Normal   Affect and Mood  Affect:  Depressed   Mood:  Depressed   Relating  Eye contact:  Fleeting   Facial expression:   Sad; Depressed   Attitude toward examiner:  Cooperative   Thought and Language  Speech flow: Soft   Thought content:  Appropriate to Mood and Circumstances   Preoccupation:  None   Hallucinations:  None   Organization:  Intact   [employer] of Knowledge:  Fair   Intelligence:  Average   Abstraction:  Normal   Judgement:  Fair   Dance movement psychotherapist:  Adequate   Insight:  Fair   Decision Making:  Normal   Social Functioning  Social Maturity:  Isolates   Social Judgement:  Normal   Stress  Stressors:  School   Coping Ability:  Overwhelmed   Skill Deficits:  None   Supports:  Family; Friends/Service system     Religion: Religion/Spirituality Are You A Religious Person?: No How Might This Affect Treatment?: NA  Leisure/Recreation: Leisure / Recreation Do You Have Hobbies?: Yes Leisure and Hobbies: PAINTING  Exercise/Diet: Exercise/Diet Do You Exercise?: No Have You Gained or Lost A Significant Amount of Weight in the Past Six Months?: No Do You Follow a Special Diet?: No Do You Have Any Trouble Sleeping?: No   CCA Employment/Education Employment/Work Situation: Employment / Work Situation Employment Situation: Surveyor, minerals Job has Been Impacted by Current Illness: No Has Patient ever Been in the [employer]?: No  Education: Education Is Patient Currently Attending School?: Yes School Currently Attending: [employer] Last Grade Completed: 15 Did You Product manager?: Yes What Type of College Degree Do you Have?: BA IN SOCIOLOGY Did You Have An Individualized Education Program (IIEP): No Did You Have Any Difficulty At School?: No Patient's Education Has Been Impacted by Current Illness: No   CCA Family/Childhood History Family and Relationship History: Family history Does patient have children?: No  Childhood History:  Childhood History By whom was/is the patient raised?: Both parents Did patient suffer any  verbal/emotional/physical/sexual abuse as a child?: No Did patient suffer from severe childhood neglect?: No Has patient ever been sexually abused/assaulted/raped as an adolescent or adult?: No Was the patient ever a victim of a crime or a disaster?: No Witnessed domestic violence?: No Has patient been affected by domestic violence as an adult?: No       CCA Substance Use Alcohol/Drug Use: Alcohol / Drug Use Pain Medications: SEE MAR Prescriptions: SEE MAR Over the Counter: SEE MAR History of alcohol / drug use?: No history of alcohol / drug abuse                         ASAM's:  Six Dimensions of Multidimensional Assessment  Dimension 1:  Acute Intoxication and/or Withdrawal Potential:  Dimension 2:  Biomedical Conditions and Complications:      Dimension 3:  Emotional, Behavioral, or Cognitive Conditions and Complications:     Dimension 4:  Readiness to Change:     Dimension 5:  Relapse, Continued use, or Continued Problem Potential:     Dimension 6:  Recovery/Living Environment:     ASAM Severity Score:    ASAM Recommended Level of Treatment:     Substance use Disorder (SUD)    Recommendations for Services/Supports/Treatments:    Disposition Recommendation per psychiatric provider: We recommend inpatient psychiatric hospitalization when medically cleared. Patient is under voluntary admission status at this time; please IVC if attempts to leave hospital.   DSM5 Diagnoses: There are no active problems to display for this patient.    Referrals to Alternative Service(s): Referred to Alternative Service(s):   Place:   Date:   Time:    Referred to Alternative Service(s):   Place:   Date:   Time:    Referred to Alternative Service(s):   Place:   Date:   Time:    Referred to Alternative Service(s):   Place:   Date:   Time:     Audree Camel, [facility]

## 2023-04-27 NOTE — ED Notes (Signed)
 Pt belongings sheet was signed and they were transferred with her to [facility]

## 2023-04-28 ENCOUNTER — Encounter (HOSPITAL_COMMUNITY): Payer: Self-pay | Admitting: Psychiatry

## 2023-04-28 ENCOUNTER — Other Ambulatory Visit: Payer: Self-pay

## 2023-04-28 DIAGNOSIS — F332 Major depressive disorder, recurrent severe without psychotic features: Secondary | ICD-10-CM | POA: Diagnosis not present

## 2023-04-28 DIAGNOSIS — F410 Panic disorder [episodic paroxysmal anxiety] without agoraphobia: Secondary | ICD-10-CM | POA: Insufficient documentation

## 2023-04-28 LAB — POCT PREGNANCY, URINE: Preg Test, Ur: NEGATIVE

## 2023-04-28 MED ORDER — VENLAFAXINE HCL ER 37.5 MG PO CP24
37.5000 mg | ORAL_CAPSULE | Freq: Every day | ORAL | Status: AC
  Administered 2023-04-29: 37.5 mg via ORAL
  Filled 2023-04-28: qty 1

## 2023-04-28 MED ORDER — VENLAFAXINE HCL ER 75 MG PO CP24
75.0000 mg | ORAL_CAPSULE | Freq: Every day | ORAL | Status: DC
  Administered 2023-04-30 – 2023-05-01 (×2): 75 mg via ORAL
  Filled 2023-04-28 (×3): qty 1

## 2023-04-28 MED ORDER — ESCITALOPRAM OXALATE 10 MG PO TABS
10.0000 mg | ORAL_TABLET | Freq: Every day | ORAL | Status: AC
  Administered 2023-04-29: 10 mg via ORAL
  Filled 2023-04-28: qty 1

## 2023-04-28 NOTE — Progress Notes (Signed)
 Patient ID: Helen Howe, female   DOB: May 10, 2000, 23 y.o.   MRN: [id number]  Patient admitted to [facility] (1) from [facility]. She is A/O x 4, ambulatory without assistance. Patient admitted with Dx: MDD. She presented to [facility] with her mom Helen Howe) with c/o SI, with ongoing feeling of hopelessness and worthlessness. On admission, patient denies SI/HI/AVH/Pain. She appeared flat, depressed, tearful, and moderately anxious. Patient states she has never been admitted to a Psychiatric Facility before. She states her home medications include Lexapro 20 mg PO Daily, in which she was compliant with. Patient denies ETOH/Drugs or Tobacco Usage. Admission process complete/consents signed. Patient verbalized understanding. Non invasive search/skin assessment complete. Patient noted with a 1/2 inch "bite" mark to her left hand/finger. No drainage, swelling, redness or odor noted. She states her dog bit her by mistake and she was vaccinated as a result. Patient states she has eczema and has to use special soap and lotion. [facility] policy noted with patient on hygiene items. Patient encouraged to inform staff of further concerns. Patient offered a meal/snack/fluid. She asked for a salad but later declined. Patient oriented to unit/room. Safety maintained with patient.

## 2023-04-28 NOTE — BHH Suicide Risk Assessment (Signed)
 [facility] Admission Suicide Risk Assessment   Nursing information obtained from:  Patient Demographic factors:  Adolescent or young adult Current Mental Status:  NA Loss Factors:  Financial problems / change in socioeconomic status Historical Factors:  Prior suicide attempts, Impulsivity Risk Reduction Factors:  Positive social support, Living with another person, especially a relative  Total Time spent with patient: 1 hour Principal Problem: Major depressive disorder, recurrent episode, severe (HCC) Diagnosis:  Principal Problem:   Major depressive disorder, recurrent episode, severe (HCC) Active Problems:   Panic disorder  Subjective Data: Helen Howe is a 23 y.o., female with a past psychiatric history of MDD treated with lexapro, anxiety, and previous suicide attempt admitted voluntarily to the [facility] from behavioral health urgent care [facility]) for evaluation and management of worsening depression and intermittent, passive suicidal ideation.    On admission, patient reports "needing a break from everything." Over the last few months, she reports a history of worsening depression. She states that the major stressor in her life is her performance at school. 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 She stated that she has not been performing well at school and that her mental health and new academic advisor Theodoro Grist have made it harder to continue and improve. She reports one of her goals is to rejoin the track team. Recently, she decided she wanted to take a break from school to manage her stress levels and decided to text her mom. During this conversation, the patient reports revealing her past suicidal thoughts and attempts to her mother, prompting her mother to bring her to the emergency department. She reports denying any suicidal ideation, plan or intent in the emergency department. She reports denying homicidal ideation and  auditory or visual hallucinations. She stated her anxiety and depression were both 10/10 (with 10 being the most severe) in the emergency department because of the chaotic environment. She confirms one past suicide attempt in Nov. 2024 where she tried to jump out of a window but was stopped by her boyfriend. She also endorses one additional instance of passive suicidal ideation without plan or intent in Dec. 2024. Additionally, she endorses one instance of self-harm a few days ago by rubbing a knife against the skin of her wrists, but denies intent to break the skin. This morning, patient reports sleep well overnight. She denies any suicidal ideation, homicidal ideation, and auditory and visual hallucinations. She reports her anxiety and depression as 3/10.    Continued Clinical Symptoms:  Alcohol Use Disorder Identification Test Final Score (AUDIT): 0 The "Alcohol Use Disorders Identification Test", Guidelines for Use in Primary Care, Second Edition.  World Science writer [facility]). Score between 0-7:  no or low risk or alcohol related problems. Score between 8-15:  moderate risk of alcohol related problems. Score between 16-19:  high risk of alcohol related problems. Score 20 or above:  warrants further diagnostic evaluation for alcohol dependence and treatment.   CLINICAL FACTORS:   Depression:   Anhedonia Hopelessness Impulsivity Insomnia   Musculoskeletal: Strength & Muscle Tone: within normal limits Gait & Station: normal Patient leans: N/A  Psychiatric Specialty Exam:  Presentation  General Appearance:  Casual  Eye Contact: Poor  Speech: -- (soft)  Speech Volume: Decreased  Handedness:No data recorded  Mood and Affect  Mood: -- (think it's easier to not be here)  Affect: Tearful; Flat   Thought Process  Thought Processes: Coherent  Descriptions of Associations:Intact  Orientation:Full (Time, Place and Person)  Thought Content:WDL  History of  Schizophrenia/Schizoaffective disorder:No  Duration of Psychotic Symptoms:No data recorded Hallucinations:Hallucinations: None  Ideas of Reference:None  Suicidal Thoughts:Suicidal Thoughts: Yes, Passive  Homicidal Thoughts:Homicidal Thoughts: No   Sensorium  Memory: Immediate Fair  Judgment: Impaired  Insight: Lacking   Executive Functions  Concentration: Fair  Attention Span: Fair  Recall: [employer] of Knowledge: Fair  Language: Fair   Psychomotor Activity  Psychomotor Activity: Psychomotor Activity: Normal   Assets  Assets: Communication Skills; Desire for Improvement; Housing; Resilience; Intimacy   Sleep  Sleep: Sleep: Good    Physical Exam: Physical Exam ROS Blood pressure 111/79, pulse 81, temperature 98.7 F (37.1 C), temperature source Oral, resp. rate 18, height 5\' 3"  (1.6 m), weight 65.3 kg, last menstrual period 04/13/2023, SpO2 100%. Body mass index is 25.51 kg/m.   COGNITIVE FEATURES THAT CONTRIBUTE TO RISK:  Thought constriction (tunnel vision)    SUICIDE RISK:   Moderate:  Frequent suicidal ideation with limited intensity, and duration, some specificity in terms of plans, no associated intent, good self-control, limited dysphoria/symptomatology, some risk factors present, and identifiable protective factors, including available and accessible social support.  PLAN OF CARE: Safety and Monitoring:             -- Voluntary admission to inpatient psychiatric unit for safety, stabilization and treatment             -- Daily contact with patient to assess and evaluate symptoms and progress in treatment             -- Patient's case to be discussed in multi-disciplinary team meeting             -- Observation Level : q15 minute checks             -- Vital signs: q12 hours             -- Precautions: suicide, elopement, and assault   2. Interventions (medications, psychoeducation, etc):              -- medical regimen:                          -- Taper Lexapro 20 mg to 10 mg once daily                         -- Start Effexor 75 mg once daily after lexapro taper                         -- Start propanolol 10 mg prn for panic attacks                         -- Discontinue Wellbutrin 150 mg              -- Patient does not need nicotine replacement   PRN medications for symptomatic management:              -- start acetaminophen 650 mg every 6 hours as needed for mild to moderate pain, fever, and headaches              -- start hydroxyzine 25 mg three times a day as needed for anxiety              -- As needed agitation protocol in-place   The risks/benefits/side-effects/alternatives to the  above medication were discussed in detail with the patient and time was given for questions. The patient consents to medication trial. FDA black box warnings, if present, were discussed.   The patient is agreeable with the medication plan, as above. We will monitor the patient's response to pharmacologic treatment, and adjust medications as necessary.   3. Routine and other pertinent labs: EKG monitoring: QTc: 422   Metabolism / endocrine: BMI: Body mass index is 25.51 kg/m. Prolactin: Recent Labs  No results found for: "PROLACTIN"   Lipid Panel: Recent Labs       Lab Results  Component Value Date    CHOL 142 04/27/2023    TRIG 50 04/27/2023    HDL 55 04/27/2023    CHOLHDL 2.6 04/27/2023    VLDL 10 04/27/2023    LDLCALC 77 04/27/2023      HbgA1c: Last Labs     Hgb A1c MFr Bld (%)  Date Value  04/27/2023 5.2      TSH: Last Labs  No results found for: "TSH"     Drugs of Abuse  Labs (Brief)  No results found for: "LABOPIA", "COCAINSCRNUR", "LABBENZ", "AMPHETMU", "THCU", "LABBARB"      4. Group Therapy:             -- Encouraged patient to participate in unit milieu and in scheduled group therapies              -- Short Term Goals: Ability to identify changes in lifestyle to reduce recurrence of  condition, verbalize feelings, identify and develop effective coping behaviors, maintain clinical measurements within normal limits, and identify triggers associated with substance abuse/mental health issues will improve. Improvement in ability to disclose and discuss suicidal ideas, demonstrate self-control, and comply with prescribed medications.             -- Long Term Goals: Improvement in symptoms so as ready for discharge -- Patient is encouraged to participate in group therapy while admitted to the psychiatric unit. -- We will address other chronic and acute stressors, which contributed to the patient's Major depressive disorder, recurrent episode, severe (HCC) in order to reduce the risk of self-harm at discharge.   5. Discharge Planning:              -- Social work and case management to assist with discharge planning and identification of hospital follow-up needs prior to discharge             -- Estimated LOS: 2-3 days             -- Discharge Concerns: Need to establish a safety plan; Medication compliance and effectiveness             -- Discharge Goals: Return home with outpatient referrals for mental health follow-up including medication management/psychotherapy  I certify that inpatient services furnished can reasonably be expected to improve the patient's condition.   Lariza Cothron Abbott Pao, MD 04/28/2023, 11:45 AM

## 2023-04-28 NOTE — Tx Team (Signed)
 Initial Treatment Plan 04/28/2023 1:27 AM [name] WUJ:[id number]    PATIENT STRESSORS: Educational concerns   Financial difficulties     PATIENT STRENGTHS: Capable of independent living  Communication skills  General fund of knowledge  Supportive family/friends    PATIENT IDENTIFIED PROBLEMS: Inability to cope with School/Financial Stress                     DISCHARGE CRITERIA:  Improved stabilization in mood, thinking, and/or behavior Reduction of life-threatening or endangering symptoms to within safe limits Verbal commitment to aftercare and medication compliance  PRELIMINARY DISCHARGE PLAN: Outpatient therapy Return to previous living arrangement  PATIENT/FAMILY INVOLVEMENT: This treatment plan has been presented to and reviewed with the patient, Helen Howe.  The patient and family have been given the opportunity to ask questions and make suggestions.  Gara Kroner, RN 04/28/2023, 1:27 AM

## 2023-04-28 NOTE — Group Note (Signed)
 Recreation Therapy Group Note   Group Topic:Animal Assisted Therapy   Group Date: 04/28/2023 Start Time: 0945 End Time: 1030 Facilitators: Francina Beery-McCall, LRT,CTRS Location: [address] Day[address]   Animal-Assisted Activity (AAA) Program Checklist/Progress Notes Patient Eligibility Criteria Checklist & Daily Group note for Rec Tx Intervention  AAA/T Program Assumption of Risk Form signed by Patient/ or Parent Legal Guardian Yes  Patient is free of allergies or severe asthma Yes  Patient reports no fear of animals Yes  Patient reports no history of cruelty to animals Yes  Patient understands his/her participation is voluntary Yes  Patient washes hands before animal contact Yes  Patient washes hands after animal contact Yes  Education: Hand Washing, Appropriate Animal Interaction   Education Outcome: Acknowledges education.    Affect/Mood: Appropriate   Participation Level: Engaged   Participation Quality: Independent   Behavior: Appropriate   Speech/Thought Process: Focused   Insight: Good   Judgement: Good   Modes of Intervention: Teaching laboratory technician   Patient Response to Interventions:  Engaged   Education Outcome:  In group clarification offered    Clinical Observations/Individualized Feedback: Patient attended session and interacted appropriately with therapy dog and peers. Patient asked appropriate questions about therapy dog and his training. Patient shared stories about their pets at home with group.      Plan: Continue to engage patient in RT group sessions 2-3x/week.   Helen Howe, LRT,CTRS 04/28/2023 1:46 PM

## 2023-04-28 NOTE — Plan of Care (Signed)
   Problem: Education: Goal: Emotional status will improve Outcome: Progressing Goal: Mental status will improve Outcome: Progressing   Problem: Activity: Goal: Sleeping patterns will improve Outcome: Progressing

## 2023-04-28 NOTE — Progress Notes (Signed)
   04/28/23 0900  Psych Admission Type (Psych Patients Only)  Admission Status Voluntary  Psychosocial Assessment  Patient Complaints None  Eye Contact Fair  Facial Expression Flat;Sad  Affect Sad;Depressed  Speech Soft  Interaction Cautious;Minimal  Motor Activity Slow  Appearance/Hygiene Unremarkable  Behavior Characteristics Cooperative  Mood Depressed;Sad  Thought Process  Coherency WDL  Content WDL  Delusions None reported or observed  Perception WDL  Hallucination None reported or observed  Judgment Impaired  Confusion None  Danger to Self  Current suicidal ideation? Denies  Description of Suicide Plan n/a  Self-Injurious Behavior No self-injurious ideation or behavior indicators observed or expressed   Agreement Not to Harm Self Yes  Description of Agreement verbal  Danger to Others  Danger to Others None reported or observed

## 2023-04-28 NOTE — Group Note (Signed)
 LCSW Group Therapy Note   Group Date: 04/28/2023 Start Time: 1100 End Time: 1200   Participation:  patient was present for the second part of the group  Type of Therapy:  Group Therapy   Topic:  Money Matters: Ecologist, Confidence and Peace of Mind  Objective: To help participants understand the impact of financial stability on well-being through the lens of Maslow's Hierarchy of Needs and develop practical strategies for budgeting, saving, and debt repayment.  Goals: Increase awareness of spending habits and financial priorities, recognizing how money supports basic needs, security, and relationships. Develop simple budgeting and saving strategies to enhance stability and peace of mind.  Reduce financial stress by creating a realistic debt repayment plan, supporting long-term confidence and well-being.  Summary:  Participants explored how financial stability connects to basic needs, relationships, and self-esteem using Maslow's Hierarchy. They discussed budgeting, saving, and debt repayment strategies, identifying small, manageable changes. Through interactive discussion and self-reflection, they gained insight into their financial habits and created personal action steps for improvement.  Therapeutic Modalities Used: Elements of Cognitive Behavioral Therapy (CBT) - Addressing financial stress and thought patterns. Psychoeducation - Engineer, agricultural. Elements of Motivational Interviewing (MI) - Encouraging realistic, achievable changes. Group Support - Reducing shame and stress through shared experiences.   Nakira Litzau O Gr[place]en [place], LCSWA 04/28/2023  6:00 PM

## 2023-04-28 NOTE — H&P (Addendum)
 Medical Student Psychiatric Admission Assessment Adult  Patient Identification: Helen Howe MRN:  [id number] Date of Evaluation:  04/28/2023  Chief Complaint:  Major depressive disorder, recurrent episode, severe (HCC) [F33.2],  Major depressive disorder, recurrent episode, severe (HCC)  Principal Problem:   Major depressive disorder, recurrent episode, severe (HCC)   History of Present Illness:  Helen Howe is a 23 y.o. female with a past psychiatric history of MDD treated with lexapro, anxiety, and previous suicide attempt admitted voluntarily to the [facility] from behavioral health urgent care [facility]) for evaluation and management of worsening depression and intermittent, passive suicidal ideation.   On admission, patient reports "needing a break from everything." Over the last few months, she reports a history of worsening depression. She states that the major stressor in her life is her performance at school. 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 She stated that she has not been performing well at school and that her mental health and new academic advisor Theodoro Grist have made it harder to continue and improve. She reports one of her goals is to rejoin the track team. Recently, she decided she wanted to take a break from school to manage her stress levels and decided to text her mom. During this conversation, the patient reports revealing her past suicidal thoughts and attempts to her mother, prompting her mother to bring her to the emergency department. She reports denying any suicidal ideation, plan or intent in the emergency department. She reports denying homicidal ideation and auditory or visual hallucinations. She stated her anxiety and depression were both 10/10 (with 10 being the most severe) in the emergency department because of the chaotic environment. She confirms one past suicide attempt in Nov. 2024 where she tried  to jump out of a window but was stopped by her boyfriend. She also endorses one additional instance of passive suicidal ideation without plan or intent in Dec. 2024. Additionally, she endorses one instance of self-harm a few days ago by rubbing a knife against the skin of her wrists, but denies intent to break the skin. This morning, patient reports sleep well overnight. She denies any suicidal ideation, homicidal ideation, and auditory and visual hallucinations. She reports her anxiety and depression as 3/10.  Patient has a past psychiatric history of MDD diagnosed in Sept. 2024 and currently treated with lexapro. On interview, she reports fluctuations in sleep (alternating episodes of insomnia or hypersomnia), low mood, decreased energy levels, trouble concentrating on school work, as well as psychomotor agitation. She reports these symptoms have been worsening for the last month.  Patient has a past history of anxiety since Sept. 2024 (not officially diagnosed). On interview, she reports excessive worry, trouble focusing, inability to sit still, and diaphoresis. She also reports increased worry meeting new people. She endorses 5-10 minute episodes of heart palpitations, chest tightness, diaphoresis, and auditory disturbances for which she goes to the bathroom to calm down.  Patient denies history of episodes of increased energy, distractibility, impulsivity, and activity for periods lasting more than one to two days. She reports random bursts of energy that make her feel better about herself.  Patient denies any delusions or history of trauma.  Chart review: On chart review, prior to this evaluation, patient has no past history of psychiatric hospitalizations or emergency department visits for psychiatric concerns.  Subjective Sleep past 24 hours: good Subjective Appetite past 24 hours: fair  Collateral information obtained [name], patient's  mom, [phone]) Patient Patient granted  permission to speak to contact person without restrictions.   Past Psychiatric History:  Previous psych diagnoses:  Major Depressive Disorder, diagnosed Sept. 2024 Prior inpatient psychiatric treatment: Denies Prior outpatient psychiatric treatment: Denies Current psychiatric provider:  Reports being seen by Dr. Shon Baton at [facility] for medications. Patient unsure if he is a psychiatrist.  Neuromodulation history: denies  Current therapist:  Sharlotte Alamo at [facility] Psychotherapy hx:  Sees above provider  History of suicide attempts:  Reports 1x prior attempt in Nov. 2024 by jumping out of a window, but was stopped by boyfriend History of homicide: Denies  Psychotropic medications: Current Lexapro, 20mg  daily - patient reportedly taking consistently, reports poor response and side effects including weight gain and reduced sexual drive Wellbutrin, 629 mg daily - recently prescribed to patient yesterday, but patient denies taking any  Past None reported   Substance Use History: Alcohol:  Drinks 1 mixed drink on special occasions Hx withdrawal tremors/shakes: denies Hx alcohol related blackouts: denies Hx alcohol induced hallucinations: denies Hx alcoholic seizures: denies Hx medical hospitalization due to severe alcohol withdrawal symptoms: denies DUI: denies  --------  Tobacco: denies Cannabis (marijuana): denies Cocaine: denies Methamphetamines: denies Psilocybin (mushrooms): denies Ecstasy (MDMA / molly): denies LSD (acid): denies Opiates (fentanyl / heroin): denies Benzos (Xanax, Klonopin): denies IV drug use: denies Prescribed meds abuse: denies  History of detox: denies History of rehab: denies  Is the patient at risk to self? No Has the patient been a risk to self in the past 6 months? Yes Has the patient been a risk to self within the distant past? Yes Is the patient a risk to others? No Has the patient been a risk to others in the past 6 months? No Has the patient  been a risk to others within the distant past? No  Alcohol Screening: 1. How often do you have a drink containing alcohol?: Never 2. How many drinks containing alcohol do you have on a typical day when you are drinking?: 1 or 2 3. How often do you have six or more drinks on one occasion?: Never AUDIT-C Score: 0 4. How often during the last year have you found that you were not able to stop drinking once you had started?: Never 5. How often during the last year have you failed to do what was normally expected from you because of drinking?: Never 6. How often during the last year have you needed a first drink in the morning to get yourself going after a heavy drinking session?: Never 7. How often during the last year have you had a feeling of guilt of remorse after drinking?: Never 8. How often during the last year have you been unable to remember what happened the night before because you had been drinking?: Never 9. Have you or someone else been injured as a result of your drinking?: No 10. Has a relative or friend or a doctor or another health worker been concerned about your drinking or suggested you cut down?: No Alcohol Use Disorder Identification Test Final Score (AUDIT): 0 Alcohol Brief Interventions/Follow-up: Alcohol education/Brief advice Tobacco Screening:    Substance Abuse History in the last 12 months: No  Allergies: patient endorses no known allergies to medications  Past Medical/Surgical History:  Medical Diagnoses: patient reports no known medical diagnoses Home Rx: patient reports no home medications Prior Hosp: patient reports prior hospitalizations in middle school for surgery on broken wrists Prior Surgeries / non-head trauma: patient reports prior  surgery on both wrists  Head trauma: denies LOC: denies Concussions: denies Seizures: denies  Last menstrual period and contraceptives: patient endorses using barrier contraception, condoms  Family History:  Medical:  patient reports not knowing family's medical history Psych: Mother - history of depression; Older sister - history of schizophrenia; Younger brother - history of ADHD Psych Rx: patient reports not knowing family's medication status Suicide: denies Homicide: denies Substance use family hx: denies  Social History:  Place of birth and grew up where: patient grew up in [place], [place]; moved to [place] in 2015 Abuse: no history of abuse Marital Status: partnered Sexual orientation: undetermined Children: Denies Employment: unemployed 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 Finances: no reliable source of income Legal: no Special educational needs teacher: never served Consulting civil engineer: denies owning any firearms Pills stockpile: denies  Lab Results:  Results for orders placed or performed during the hospital encounter of 04/27/23 (from the past 48 hours)  CBC with Differential/Platelet     Status: None   Collection Time: 04/27/23  4:50 PM  Result Value Ref Range   WBC 7.4 4.0 - 10.5 K/uL   RBC 4.68 3.87 - 5.11 MIL/uL   Hemoglobin 12.6 12.0 - 15.0 g/dL   HCT 64.4 03.4 - 74.2 %   MCV 83.1 80.0 - 100.0 fL   MCH 26.9 26.0 - 34.0 pg   MCHC 32.4 30.0 - 36.0 g/dL   RDW 59.5 63.8 - 75.6 %   Platelets 292 150 - 400 K/uL   nRBC 0.0 0.0 - 0.2 %   Neutrophils Relative % 69 %   Neutro Abs 5.1 1.7 - 7.7 K/uL   Lymphocytes Relative 22 %   Lymphs Abs 1.6 0.7 - 4.0 K/uL   Monocytes Relative 5 %   Monocytes Absolute 0.4 0.1 - 1.0 K/uL   Eosinophils Relative 3 %   Eosinophils Absolute 0.3 0.0 - 0.5 K/uL   Basophils Relative 1 %   Basophils Absolute 0.1 0.0 - 0.1 K/uL   Immature Granulocytes 0 %   Abs Immature Granulocytes 0.03 0.00 - 0.07 K/uL    Comment: Performed at [facility], [address], [place], [place] [address]  Comprehensive metabolic panel     Status: Abnormal   Collection Time: 04/27/23  4:50 PM  Result Value Ref Range    Sodium 137 135 - 145 mmol/L   Potassium 4.2 3.5 - 5.1 mmol/L   Chloride 106 98 - 111 mmol/L   CO2 22 22 - 32 mmol/L   Glucose, Bld 87 70 - 99 mg/dL    Comment: Glucose reference range applies only to samples taken after fasting for at least 8 hours.   BUN 13 6 - 20 mg/dL   Creatinine, Ser 5.18 0.44 - 1.00 mg/dL   Calcium 9.3 8.9 - 84.1 mg/dL   Total Protein 7.1 6.5 - 8.1 g/dL   Albumin 3.8 3.5 - 5.0 g/dL   AST 23 15 - 41 U/L   ALT 17 0 - 44 U/L   Alkaline Phosphatase 24 (L) 38 - 126 U/L   Total Bilirubin 0.6 0.0 - 1.2 mg/dL   GFR, Estimated >66 >06 mL/min    Comment: (NOTE) Calculated using the CKD-EPI Creatinine Equation (2021)    Anion gap 9 5 - 15    Comment: Performed at [facility], [address], [place], [place] [address]  Hemoglobin A1c     Status: None   Collection Time:  04/27/23  4:50 PM  Result Value Ref Range   Hgb A1c MFr Bld 5.2 4.8 - 5.6 %    Comment: (NOTE) Pre diabetes:          5.7%-6.4%  Diabetes:              >6.4%  Glycemic control for   <7.0% adults with diabetes    Mean Plasma Glucose 102.54 mg/dL    Comment: Performed at [facility], [address], [place], [place] [address]  Ethanol     Status: None   Collection Time: 04/27/23  4:50 PM  Result Value Ref Range   Alcohol, Ethyl (B) <10 <10 mg/dL    Comment: (NOTE) Lowest detectable limit for serum alcohol is 10 mg/dL.  For medical purposes only. Performed at [facility], [address] [place], [place] [address]   Lipid panel     Status: None   Collection Time: 04/27/23  4:50 PM  Result Value Ref Range   Cholesterol 142 0 - 200 mg/dL   Triglycerides 50 <403 mg/dL   HDL 55 >47 mg/dL   Total CHOL/HDL Ratio 2.6 RATIO   VLDL 10 0 - 40 mg/dL   LDL Cholesterol 77 0 - 99 mg/dL    Comment:        Total Cholesterol/HDL:CHD Risk Coronary Heart Disease Risk Table                     Men   Women  1/2 Average Risk   3.4   3.3  Average Risk       5.0   4.4  2 X Average Risk    9.6   7.1  3 X Average Risk  23.4   11.0        Use the calculated Patient Ratio above and the CHD Risk Table to determine the patient's CHD Risk.        ATP III CLASSIFICATION (LDL):  <100     mg/dL   Optimal  425-956  mg/dL   Near or Above                    Optimal  130-159  mg/dL   Borderline  387-564  mg/dL   High  >332     mg/dL   Very High Performed at [facility], [address] [place], [place] [address]   POCT Urine Drug Screen - (I-Screen)     Status: Abnormal   Collection Time: 04/27/23  4:55 PM  Result Value Ref Range   POC Amphetamine UR None Detected    POC Secobarbital (BAR) None Detected    POC Buprenorphine (BUP) None Detected    POC Oxazepam (BZO) None Detected    POC Cocaine UR None Detected    POC Methamphetamine UR None Detected    POC Morphine None Detected    POC Methadone UR None Detected    POC Oxycodone UR None Detected    POC Marijuana UR Positive (A)     Blood Alcohol level:  Lab Results  Component Value Date   ETH <10 04/27/2023    Metabolic Disorder Labs:  Lab Results  Component Value Date   HGBA1C 5.2 04/27/2023   MPG 102.54 04/27/2023   No results found for: "PROLACTIN" Lab Results  Component Value Date   CHOL 142 04/27/2023   TRIG 50 04/27/2023   HDL 55 04/27/2023   CHOLHDL 2.6 04/27/2023   VLDL 10  04/27/2023   LDLCALC 77 04/27/2023    Current Medications: Current Facility-Administered Medications  Medication Dose Route Frequency Provider Last Rate Last Admin   acetaminophen (TYLENOL) tablet 650 mg  650 mg Oral Q6H PRN Karie Fetch, MD       alum & mag hydroxide-simeth (MAALOX/MYLANTA) 200-200-20 MG/5ML suspension 30 mL  30 mL Oral Q4H PRN Karie Fetch, MD       buPROPion (WELLBUTRIN XL) 24 hr tablet 150 mg  150 mg Oral Daily Karie Fetch, MD   150 mg at 04/28/23 1610   haloperidol (HALDOL) tablet 5 mg  5 mg Oral TID PRN Karie Fetch, MD       And   diphenhydrAMINE (BENADRYL) capsule 50 mg  50 mg  Oral TID PRN Karie Fetch, MD       escitalopram (LEXAPRO) tablet 20 mg  20 mg Oral Daily Karie Fetch, MD   20 mg at 04/28/23 9604   hydrOXYzine (ATARAX) tablet 25 mg  25 mg Oral TID PRN Karie Fetch, MD   25 mg at 04/27/23 2236   magnesium hydroxide (MILK OF MAGNESIA) suspension 30 mL  30 mL Oral Daily PRN Karie Fetch, MD       traZODone (DESYREL) tablet 50 mg  50 mg Oral QHS PRN Karie Fetch, MD   50 mg at 04/27/23 2236    PTA Medications: Medications Prior to Admission  Medication Sig Dispense Refill Last Dose/Taking   escitalopram (LEXAPRO) 20 MG tablet Take 1 tablet (20 mg total) by mouth daily. 60 tablet 1 Past Week   buPROPion (WELLBUTRIN XL) 150 MG 24 hr tablet Take 1 tablet (150 mg total) by mouth daily.       Physical Findings: AIMS: No  CIWA:    COWS:     Psychiatric Specialty Exam: General Appearance:  Casual   Eye Contact:  Poor   Speech:  Soft   Volume:  Decreased   Mood:  "Need a break from everything"   Affect:  Flat   Thought Content:  Normal   Suicidal Thoughts:  Denied suicidal thoughts   Homicidal Thoughts:  Denied homicidal thoughts   Thought Process:  Coherent   Orientation:  Full (Time, Place and Person)     Memory:  Immediate Fair   Judgment:  Impaired   Insight:  Lacking   Concentration:  Fair   Recall:  [employer] of Knowledge:  Fair   Language:  Fair   Psychomotor Activity:  Psychomotor Activity: Normal   Assets:  Manufacturing systems engineer; Desire for Improvement; Housing; Resilience; Intimacy   Sleep:  Well; Reported 8 hours    Review of Systems Review of Systems  Constitutional:  Negative for chills and fever.  Respiratory:  Negative for cough and wheezing.   Cardiovascular:  Negative for chest pain and palpitations.  Gastrointestinal:  Negative for constipation, diarrhea, nausea and vomiting.  Skin:  Negative for rash.  Neurological:  Negative for headaches.   Psychiatric/Behavioral:  Positive for depression. Negative for hallucinations, substance abuse and suicidal ideas. The patient is nervous/anxious. The patient does not have insomnia.     Vital signs: Blood pressure 111/79, pulse 81, temperature 98.7 F (37.1 C), temperature source Oral, resp. rate 18, height 5\' 3"  (1.6 m), weight 65.3 kg, last menstrual period 04/13/2023, SpO2 100%. Body mass index is 25.51 kg/m.  Physical Exam Constitutional:      Appearance: Normal appearance.  Eyes:     Conjunctiva/sclera: Conjunctivae normal.  Pulmonary:     Effort: Pulmonary  effort is normal.  Musculoskeletal:        General: Normal range of motion.  Neurological:     Mental Status: She is alert and oriented to person, place, and time.  Psychiatric:        Thought Content: Thought content normal.     Assets  Assets:Communication Skills; Desire for Improvement; Housing; Resilience; Intimacy   Treatment Plan Summary: Daily contact with patient to assess and evaluate symptoms and progress in treatment and medication management  ASSESSMENT: Helen Howe is a 23 y.o. female with a past psychiatric history of MDD treated with lexapro, anxiety, and previous suicide attempt admitted voluntarily to the [facility] from behavioral health urgent care [facility]) for evaluation and management of worsening depression and intermittent, passive suicidal ideation.   On assessment today, patient continues to require inpatient hospitalization for stabilization of mood symptoms. During the interview, we discussed her most significant stressor, college. We also discussed her current medication regimen (lexapro) and how she has reported that the medication does not seem to work like before. She confirmed experiencing side effects from the medication including weight gain and sexual dysfunction. Patient also reported not taking recently prescribed buproprion. We will consider tapering lexapro and starting  patient on an antidepressant from another class, such as effexor.  Diagnoses - Major Depressive Disorder, Severe - Panic Disorder  PLAN: Safety and Monitoring:  -- Voluntary admission to inpatient psychiatric unit for safety, stabilization and treatment  -- Daily contact with patient to assess and evaluate symptoms and progress in treatment  -- Patient's case to be discussed in multi-disciplinary team meeting  -- Observation Level : q15 minute checks  -- Vital signs: q12 hours  -- Precautions: suicide, elopement, and assault  2. Interventions (medications, psychoeducation, etc):              -- medical regimen:   -- Taper Lexapro 20 mg to 10 mg once daily   -- Start Effexor-XR 37.5 mg first dose at breakfast on 04/29/23, increasing to 75 mg once daily at breakfast on 04/30/23 after lexapro taper   -- Start propanolol 10 mg prn for panic attacks   -- Discontinue Wellbutrin 150 mg   -- Patient does not need nicotine replacement  PRN medications for symptomatic management:              -- start acetaminophen 650 mg every 6 hours as needed for mild to moderate pain, fever, and headaches              -- start hydroxyzine 25 mg three times a day as needed for anxiety   -- As needed agitation protocol in-place  The risks/benefits/side-effects/alternatives to the above medication were discussed in detail with the patient and time was given for questions. The patient consents to medication trial. FDA black box warnings, if present, were discussed.  The patient is agreeable with the medication plan, as above. We will monitor the patient's response to pharmacologic treatment, and adjust medications as necessary.  3. Routine and other pertinent labs: EKG monitoring: QTc: 422  Metabolism / endocrine: BMI: Body mass index is 25.51 kg/m. Prolactin: No results found for: "PROLACTIN" Lipid Panel: Lab Results  Component Value Date   CHOL 142 04/27/2023   TRIG 50 04/27/2023   HDL 55  04/27/2023   CHOLHDL 2.6 04/27/2023   VLDL 10 04/27/2023   LDLCALC 77 04/27/2023   HbgA1c: Hgb A1c MFr Bld (%)  Date Value  04/27/2023 5.2   TSH: No results  found for: "TSH"  Drugs of Abuse  No results found for: "LABOPIA", "COCAINSCRNUR", "LABBENZ", "AMPHETMU", "THCU", "LABBARB"   4. Group Therapy:  -- Encouraged patient to participate in unit milieu and in scheduled group therapies   -- Short Term Goals: Ability to identify changes in lifestyle to reduce recurrence of condition, verbalize feelings, identify and develop effective coping behaviors, maintain clinical measurements within normal limits, and identify triggers associated with substance abuse/mental health issues will improve. Improvement in ability to disclose and discuss suicidal ideas, demonstrate self-control, and comply with prescribed medications.  -- Long Term Goals: Improvement in symptoms so as ready for discharge -- Patient is encouraged to participate in group therapy while admitted to the psychiatric unit. -- We will address other chronic and acute stressors, which contributed to the patient's Major depressive disorder, recurrent episode, severe (HCC) in order to reduce the risk of self-harm at discharge.  5. Discharge Planning:   -- Social work and case management to assist with discharge planning and identification of hospital follow-up needs prior to discharge  -- Estimated LOS: 2-3 days  -- Discharge Concerns: Need to establish a safety plan; Medication compliance and effectiveness  -- Discharge Goals: Return home with outpatient referrals for mental health follow-up including medication management/psychotherapy  I certify that inpatient services furnished can reasonably be expected to improve the patient's condition.  Signed: Janith Lima, Medical Student 04/28/2023, 10:20 AM

## 2023-04-28 NOTE — Plan of Care (Signed)
  Problem: Education: Goal: Knowledge of [facility] Education information/materials will improve Outcome: Progressing Goal: Mental status will improve Outcome: Progressing Goal: Verbalization of understanding the information provided will improve Outcome: Progressing   Problem: Education: Goal: Emotional status will improve Outcome: Not Progressing

## 2023-04-28 NOTE — [facility] Group Notes (Signed)
 [facility] Group Notes:  (Nursing/MHT/Case Management/Adjunct)  Date:  04/28/2023  Time:  10:21 PM  Type of Therapy:  Psychoeducational Skills  Participation Level:  Active  Participation Quality:  Attentive  Affect:  Appropriate  Cognitive:  Appropriate  Insight:  Appropriate  Engagement in Group:  Engaged  Modes of Intervention:  Education  Summary of Progress/Problems: the patient rated her day as an 8 out of 10. She verbalized that her positive event for the day was that her peers made things "easier" for her. Her goal for tomorrow is to spend less time in her bedroom.   Helen Howe 04/28/2023, 10:21 PM

## 2023-04-29 ENCOUNTER — Encounter (HOSPITAL_COMMUNITY): Payer: Self-pay

## 2023-04-29 DIAGNOSIS — F332 Major depressive disorder, recurrent severe without psychotic features: Secondary | ICD-10-CM | POA: Diagnosis not present

## 2023-04-29 LAB — TSH: TSH: 0.475 u[IU]/mL (ref 0.350–4.500)

## 2023-04-29 NOTE — [facility] Group Notes (Signed)
 [facility] Group Notes:  (Nursing/MHT/Case Management/Adjunct)  Date:  04/29/2023  Time:  8:14 PM  Type of Therapy:   NA Group  Participation Level:  Active  Participation Quality:  Appropriate  Affect:  Appropriate  Cognitive:  Appropriate  Insight:  Appropriate  Engagement in Group:  Engaged  Modes of Intervention:  Education  Summary of Progress/Problems: Attended NA meeting.  Noah Delaine 04/29/2023, 8:14 PM

## 2023-04-29 NOTE — Plan of Care (Signed)
  Problem: Education: Goal: Emotional status will improve Outcome: Progressing Goal: Verbalization of understanding the information provided will improve Outcome: Progressing   Problem: Activity: Goal: Sleeping patterns will improve Outcome: Progressing   Problem: Coping: Goal: Ability to verbalize frustrations and anger appropriately will improve Outcome: Progressing   Problem: Health Behavior/Discharge Planning: Goal: Compliance with treatment plan for underlying cause of condition will improve Outcome: Progressing

## 2023-04-29 NOTE — [facility] Group Notes (Signed)
 Group Topic: Activities that bring joy/memories  Goals: Reignite passions that may be significant sources of hope/possibility, reawaken memories that we hold dear that may also be part of healing grief, consider new traditions/values and reflect on what is "spiritual" about doing things that bring [place] joy, fulfillment, and peace.  Theoretical basis: All group dynamics rooted in group psychotherapeutic approaches of Chyrl Civatte, also Rogerian and Relational Cultural Therapeutic concepts that aim to foster relational support, mutual empathy, and unconditional positive regard/acceptance.  Observations: Helen Howe was an active participant in the group and offered peers empathy.  Helen Howe, M.Div [phone]

## 2023-04-29 NOTE — Progress Notes (Signed)
   04/29/23 0900  Psych Admission Type (Psych Patients Only)  Admission Status Voluntary  Psychosocial Assessment  Patient Complaints Depression  Eye Contact Fair  Facial Expression Flat;Sad  Affect Depressed  Speech Logical/coherent;Soft  Interaction Cautious  Motor Activity Other (Comment) (WNL)  Appearance/Hygiene Unremarkable  Behavior Characteristics Cooperative  Mood Depressed  Thought Process  Coherency WDL  Content WDL  Delusions None reported or observed  Perception WDL  Hallucination None reported or observed  Judgment Poor  Confusion None  Danger to Self  Current suicidal ideation? Denies  Agreement Not to Harm Self Yes  Description of Agreement verbal  Danger to Others  Danger to Others None reported or observed

## 2023-04-29 NOTE — Group Note (Signed)
 Date:  04/29/2023 Time:  12:00 PM  Group Topic/Focus:  Dimensions of Wellness:   The focus of this group is to introduce the topic of physical wellness and discuss its impact on mental health.     Participation Level:  Active  Participation Quality:  Appropriate  Affect:  Appropriate  Cognitive:  Appropriate  Insight: Appropriate  Engagement in Group:  Engaged  Modes of Intervention:  Discussion and Education  Additional Comments:    Emersyn Kotarski D Azel Gumina 04/29/2023, 12:00 PM

## 2023-04-29 NOTE — [facility] Counselor (Signed)
 Adult Comprehensive Assessment  Patient ID: Helen Howe, female   DOB: 05-08-2000, 23 y.o.   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, petroleum / Lack of resources (include bankruptcy): "yes" Housing / Lack of housing: "no" Physical health (include injuries & life threatening diseases): "no" Social relationships: "no" Substance abuse: "no" Bereavement / Loss: "no"  Living/Environment/Situation:  Living Arrangements: Spouse/significant other Who else lives in the home?: "i live in an apartment across the [address] from [place] with my boyfriend" How long has patient lived in current situation?: "since August 2024" What is atmosphere in current home: Comfortable, Loving, Supportive  Family History:  Marital status: Other (comment) Does patient have children?: No  Childhood History:  By whom was/is the patient raised?: Mother Description of patient's relationship with caregiver when they were a child: "good" Patient's description of current relationship with people who raised him/her: "good" How were you disciplined when you got in trouble as a child/adolescent?: "getting grounded" Does patient have siblings?: Yes Number of Siblings: 2 Description of patient's current relationship with siblings: "good" Did patient suffer any verbal/emotional/physical/sexual abuse as a child?: No Did patient suffer from severe childhood neglect?: No Has patient ever been sexually abused/assaulted/raped as an adolescent or adult?: No Was the patient ever a victim of a crime or a disaster?: No Witnessed domestic violence?:  No Has patient been affected by domestic violence as an adult?: No  Education:  Highest grade of school patient has completed: Hydrologist in college, sociology" Currently a student?: Yes Name of school: "[employer]" How long has the patient attended?: "52yrs" Learning disability?: No  Employment/Work Situation:   Employment Situation: Surveyor, minerals Job has Been Impacted by Current Illness: No What is the Longest Time Patient has Held a Job?: n/a Has Patient ever Been in the [employer]?: No  Financial Resources:   Surveyor, quantity resources: Media planner Does patient have a Lawyer or guardian?: No  Alcohol/Substance Abuse:   What has been your use of drugs/alcohol within the last 12 months?: "no" Alcohol/Substance Abuse Treatment Hx: Denies past history Has alcohol/substance abuse ever caused legal problems?: No  Social Support System:   Conservation officer, nature Support System: Good Describe Community Support System: "my whole family" Type of faith/religion: "no" How does patient's faith help to cope with current illness?: n/a  Leisure/Recreation:   Do You Have Hobbies?: Yes Leisure and Hobbies: "read, paint"  Strengths/Needs:   What is the patient's perception of their strengths?: "a fast learner, a fast runner" Patient states they can use these personal strengths during their treatment to contribute to their recovery: "they would cause me to get ouside more" Patient states these barriers may affect/interfere with their treatment: "no" Patient states these barriers may affect their return to the community: "no" Other important information patient would like considered in planning for their treatment: "no"  Discharge Plan:   Currently receiving community mental health services: Yes (From Whom) Patient states concerns and preferences for aftercare planning are: "no" Patient states they will know when they are safe and ready for discharge when: "i feel safe now, i feel alot like  myself more than before i got here" Does patient have access to transportation?: Yes Does patient  have financial barriers related to discharge medications?: No Patient description of barriers related to discharge medications: none pt has BCBS Will patient be returning to same living situation after discharge?: Yes  Summary/Recommendations:   Summary and Recommendations (to be completed by the evaluator): Pt is a 23 y.o female VOL admission d/t not feeling not like herself some changes in mood and behaviors over the past 6 months. 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 Pt reports having naural supports and receiving therapy from [facility]. Pt would benefit from continued therapy and medication management.  Steffanie Dunn. LCSWA 04/29/2023

## 2023-04-29 NOTE — Progress Notes (Signed)
 [facility] Medical Student Progress Note  04/29/2023 10:08 AM Helen Howe  MRN:  [id number]  Principal Problem: MDD (major depressive disorder), recurrent severe, without psychosis (HCC) Diagnosis: Principal Problem:   MDD (major depressive disorder), recurrent severe, without psychosis (HCC) Active Problems:   Panic disorder   Reason for Admission:  Helen Howe is a 23 y.o. female with a past psychiatric history of MDD treated with lexapro, anxiety, and previous suicide attempt admitted voluntarily to the [facility] from behavioral health urgent care [name]) for evaluation and management of worsening depression and intermittent, passive suicidal ideation. (admitted on 04/27/2023, total  LOS: 2 days )  Chart Review from last 24 hours:  The patient's chart was reviewed and nursing notes were reviewed. The patient's case was discussed in multidisciplinary team meeting.   - Overnight events to report per chart review / staff report: no notable overnight events to report - Patient received all scheduled medications - Patient received the following PRN medications: acetaminophen, hydroxyzine, and trazodone  Information Obtained Today During Patient Interview: The patient was seen and evaluated on the unit. On assessment today the patient reports feeling good. She described her mood as "good." She reports spending most of her time yesterday adding stickers to the book in her room, socializing, attending group sessions, and spending time outdoors. She endorsed spending the evening playing games, talking to other patients, and watching a movie. She stated her anxiety and depression were both 2/10 (with 10 being the most severe). She denied SI, HI, and AVH. She reported asking for acetaminophen for a headache as well as trazodone for sleep. She reported sleeping well last night for 8 hours. Nursing reported 7.75 hours of sleep. This morning she reported eating oatmeal and cereal for breakfast and  hydrating. She reported spending the morning adding stickers to her book, socializing, and listening to music. She endorsed a bowel movement yesterday evening. She stated her anxiety and depression were both 2/10. She denied SI, HI, and AVH in the morning. She stated that she felt a lot better and "felt more like herself" since medication changes. She denied any medication side effects. She reported looking forward to discharge.   Collateral information obtained   [name], patient's mom, [phone]) Patient granted permission to speak to contact person without restrictions.  Patient's mom reported that she noticed changes with patient's behavior about 1 year ago, but did not know what caused the shift in her behavior. In regards to recent hospitalization, patient's mom stated that the patient told her "not being here would be easier." The patient's mom reported that she did not think the patient was actively suicidal, but stated that the patient had verbalized passive suicidal thoughts. The patient's mom stated that she did not think the patient had a plan or intent, did not write a note (found no evidence at patient's house), and did not have an established timeline or explanation. The patient's mom endorsed the patient communicating she did not want to do anything "to make her [the patient's mom] sad." The patient's mom denied visiting patient because of her schedule but endorsed talking to her over the phone. She reported that the patient "sounded like herself" and "sounded most like herself in a long time." She reported the patient sounded a lot better. She denied having talked to the patient about plans upon discharge. She reported being open to the patient living with whomever. The patient's mother planned to visit often if patient was discharged to the residence with her  boyfriend. For safety planning, the patient's mother denied having firearms in the house and denied stockpile of pills.  The patient's mother reported looking forward to having a plan in place upon discharge for patient's care.  Florene Glen, patient's boyfriend, [phone]) Patient granted permission to speak to contact person without restrictions.  Patient's boyfriend reported that he noticed changes with the patient's behavior starting 11/2022. In regards to recent hospitalization, he stated that he thought the patient may have been very acutely suicidal prior to admission. He reported that the patient had a multi-tool in her hand, but did not use it. He denied the patient verbalizing any plan or intent, writing a note, or having an established timeline or explanation. He reported the patient had a history of suicidal thoughts since 11/2022, often related to feeling overwhelmed with school/schoolwork. He reported the patient had a history of trying to cut herself three times since January. He reported visiting the patient yesterday and stated that "she seems to be doing better" and "seems to be more like herself." He reported being open to the idea of the patient being discharged to their shared residence and denied any concerns. For safety planning, the patient's boyfriend denied having firearms in the house and denied stockpile of pills.     Past Psychiatric History:  Previous psych diagnoses:  Major Depressive Disorder, diagnosed Sept. 2024 Prior inpatient psychiatric treatment: Denies Prior outpatient psychiatric treatment: Denies Current psychiatric provider:  Reports being seen by Dr. Shon Baton at [facility] for medications. Patient unsure if he is a psychiatrist.   Neuromodulation history: denies   Current therapist:  Sharlotte Alamo at [facility] Psychotherapy hx:  Sees above provider   History of suicide attempts:  Reports 1x prior attempt in Nov. 2024 by jumping out of a window, but was stopped by boyfriend History of homicide: Denies  Past Medical History: History reviewed. No pertinent past medical  history.  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 Finances: no reliable source of income Legal: no Special educational needs teacher: never served Consulting civil engineer: denies owning any firearms Pills stockpile: denies  Current Medications: Current Facility-Administered Medications  Medication Dose Route Frequency Provider Last Rate Last Admin   acetaminophen (TYLENOL) tablet 650 mg  650 mg Oral Q6H PRN Karie Fetch, MD   650 mg at 04/28/23 1631   alum & mag hydroxide-simeth (MAALOX/MYLANTA) 200-200-20 MG/5ML suspension 30 mL  30 mL Oral Q4H PRN Karie Fetch, MD       buPROPion (WELLBUTRIN XL) 24 hr tablet 150 mg  150 mg Oral Daily Karie Fetch, MD   150 mg at 04/29/23 0951   haloperidol (HALDOL) tablet 5 mg  5 mg Oral TID PRN Karie Fetch, MD       And   diphenhydrAMINE (BENADRYL) capsule 50 mg  50 mg Oral TID PRN Karie Fetch, MD       hydrOXYzine (ATARAX) tablet 25 mg  25 mg Oral TID PRN Karie Fetch, MD   25 mg at 04/28/23 2138   magnesium hydroxide (MILK  OF MAGNESIA) suspension 30 mL  30 mL Oral Daily PRN Karie Fetch, MD       traZODone (DESYREL) tablet 50 mg  50 mg Oral QHS PRN Karie Fetch, MD   50 mg at 04/28/23 2138   [START ON 04/30/2023] venlafaxine XR (EFFEXOR-XR) 24 hr capsule 75 mg  75 mg Oral Q breakfast Sarita Bottom, MD        Lab Results:  Results for orders placed or performed during the  hospital encounter of 04/27/23 (from the past 48 hours)  CBC with Differential/Platelet     Status: None   Collection Time: 04/27/23  4:50 PM  Result Value Ref Range   WBC 7.4 4.0 - 10.5 K/uL   RBC 4.68 3.87 - 5.11 MIL/uL   Hemoglobin 12.6 12.0 - 15.0 g/dL   HCT 16.1 09.6 - 04.5 %   MCV 83.1 80.0 - 100.0 fL   MCH 26.9 26.0 - 34.0 pg   MCHC 32.4 30.0 - 36.0 g/dL   RDW 40.9 81.1 - 91.4 %   Platelets 292 150 - 400 K/uL   nRBC 0.0 0.0 - 0.2 %   Neutrophils Relative % 69 %   Neutro Abs 5.1 1.7 - 7.7 K/uL   Lymphocytes Relative 22 %   Lymphs Abs 1.6 0.7 - 4.0 K/uL   Monocytes Relative 5 %   Monocytes Absolute 0.4 0.1 - 1.0 K/uL   Eosinophils Relative 3 %   Eosinophils Absolute 0.3 0.0 - 0.5 K/uL   Basophils Relative 1 %   Basophils Absolute 0.1 0.0 - 0.1 K/uL   Immature Granulocytes 0 %   Abs Immature Granulocytes 0.03 0.00 - 0.07 K/uL    Comment: Performed at [facility] Lab, [address], [place], [place] [address]  Comprehensive metabolic panel     Status: Abnormal   Collection Time: 04/27/23  4:50 PM  Result Value Ref Range   Sodium 137 135 - 145 mmol/L   Potassium 4.2 3.5 - 5.1 mmol/L   Chloride 106 98 - 111 mmol/L   CO2 22 22 - 32 mmol/L   Glucose, Bld 87 70 - 99 mg/dL    Comment: Glucose reference range applies only to samples taken after fasting for at least 8 hours.   BUN 13 6 - 20 mg/dL   Creatinine, Ser 6.21 0.44 - 1.00 mg/dL   Calcium 9.3 8.9 - 30.8 mg/dL   Total Protein 7.1 6.5 - 8.1 g/dL   Albumin 3.8 3.5 - 5.0 g/dL   AST 23 15 - 41 U/L   ALT 17 0 - 44 U/L   Alkaline Phosphatase 24 (L) 38 - 126 U/L   Total Bilirubin 0.6 0.0 - 1.2 mg/dL   GFR, Estimated >65 >78 mL/min    Comment: (NOTE) Calculated using the CKD-EPI Creatinine Equation (2021)    Anion gap 9 5 - 15    Comment: Performed at [facility], [address] [place], [place] [address]  Hemoglobin A1c     Status: None   Collection Time: 04/27/23  4:50 PM  Result Value Ref Range   Hgb A1c  MFr Bld 5.2 4.8 - 5.6 %    Comment: (NOTE) Pre diabetes:          5.7%-6.4%  Diabetes:              >6.4%  Glycemic control for   <7.0% adults with diabetes    Mean Plasma Glucose 102.54 mg/dL    Comment: Performed at [facility]  [facility], [address] [place], [place] [address]  Ethanol     Status: None   Collection Time: 04/27/23  4:50 PM  Result Value Ref Range   Alcohol, Ethyl (B) <10 <10 mg/dL    Comment: (NOTE) Lowest detectable limit for serum alcohol is 10 mg/dL.  For medical purposes only. Performed at [facility], [address] [place], [place] [address]   Lipid panel     Status: None   Collection Time: 04/27/23  4:50 PM  Result Value Ref Range   Cholesterol 142 0 - 200 mg/dL   Triglycerides 50 <829 mg/dL   HDL 55 >56 mg/dL   Total CHOL/HDL Ratio 2.6 RATIO   VLDL 10 0 - 40 mg/dL   LDL Cholesterol 77 0 - 99 mg/dL    Comment:        Total Cholesterol/HDL:CHD Risk Coronary Heart Disease Risk Table                     Men   Women  1/2 Average Risk   3.4   3.3  Average Risk       5.0   4.4  2 X Average Risk   9.6   7.1  3 X Average Risk  23.4   11.0        Use the calculated Patient Ratio above and the CHD Risk Table to determine the patient's CHD Risk.        ATP III CLASSIFICATION (LDL):  <100     mg/dL   Optimal  213-086  mg/dL   Near or Above                    Optimal  130-159  mg/dL   Borderline  578-469  mg/dL   High  >629     mg/dL   Very High Performed at [facility], [address], [place], [place] [address]   POCT Urine Drug Screen - (I-Screen)     Status: Abnormal   Collection Time: 04/27/23  4:55 PM  Result Value Ref Range   POC Amphetamine UR None Detected    POC Secobarbital (BAR) None Detected    POC Buprenorphine (BUP) None Detected    POC Oxazepam (BZO) None Detected    POC Cocaine UR None Detected    POC Methamphetamine UR None Detected    POC Morphine None Detected    POC Methadone UR None Detected    POC  Oxycodone UR None Detected    POC Marijuana UR Positive (A)   Pregnancy, urine POC     Status: None   Collection Time: 04/27/23  4:59 PM  Result Value Ref Range   Preg Test, Ur NEGATIVE NEGATIVE    Comment:        THE SENSITIVITY OF THIS METHODOLOGY IS >24 mIU/mL     Blood Alcohol level:  Lab Results  Component Value Date   ETH <10 04/27/2023    Metabolic Labs: Lab Results  Component Value Date   HGBA1C 5.2 04/27/2023   MPG 102.54 04/27/2023   No results found for: "PROLACTIN" Lab Results  Component Value Date   CHOL 142 04/27/2023   TRIG 50 04/27/2023   HDL 55 04/27/2023   CHOLHDL 2.6 04/27/2023   VLDL 10 04/27/2023   LDLCALC 77 04/27/2023    Physical Findings: AIMS: No  CIWA:    COWS:     Psychiatric Specialty Exam: General Appearance: Casual  Eye Contact: Poor   Speech: Soft   Volume: Decreased   Mood: "Good"   Affect: Flat   Thought Content: WDL   Suicidal Thoughts: Patient denies suicidal thoughts  Homicidal Thoughts: Patient denies homicidal thoughts  Thought Process: Coherent   Orientation: Full (Time, Place and Person)     Memory: Immediate Fair   Judgment: Impaired   Insight: Lacking   Concentration: Fair   Recall: [employer] of Knowledge: Fair   Language: Fair   Psychomotor Activity: No data recorded  Assets: Communication Skills; Desire for Improvement; Housing; Resilience; Intimacy   Sleep: Good, Patient reports 8 hours   Review of Systems Review of Systems  Constitutional:  Negative for chills and fever.  Respiratory:  Negative for cough.   Gastrointestinal:  Negative for constipation.  Psychiatric/Behavioral:  Positive for depression. Negative for hallucinations and suicidal ideas. The patient is nervous/anxious. The patient does not have insomnia.     Vital Signs: Blood pressure 111/75, pulse 75, temperature 99 F (37.2 C), temperature source Oral, resp. rate 18, height 5\' 3"  (1.6 m), weight 65.3 kg, last  menstrual period 04/13/2023, SpO2 100%. Body mass index is 25.51 kg/m.  Physical Exam Constitutional:      Appearance: Normal appearance.  Eyes:     Conjunctiva/sclera: Conjunctivae normal.  Pulmonary:     Effort: Pulmonary effort is normal.  Musculoskeletal:        General: Normal range of motion.  Neurological:     Mental Status: She is alert and oriented to person, place, and time.  Psychiatric:        Thought Content: Thought content normal.     Assets  Assets: Communication Skills; Desire for Improvement; Housing; Resilience; Intimacy   Treatment Plan Summary: Daily contact with patient to assess and evaluate symptoms and progress in treatment and Medication management  Diagnoses / Active Problems: MDD (major depressive disorder), recurrent severe, without psychosis (HCC) Principal Problem:   MDD (major depressive disorder), recurrent severe, without psychosis (HCC) Active Problems:   Panic disorder   ASSESSMENT: Helen Howe is a 23 y.o. female with a past psychiatric history of MDD treated with lexapro, anxiety, and previous suicide attempt admitted voluntarily to the [facility] from behavioral health urgent care [facility]) for evaluation and management of worsening depression and intermittent, passive suicidal ideation.  On assessment, patient continues to require inpatient hospitalization for stabilization of mood symptoms. On interview, we discussed the patient's anxiety and depression levels. The patient reported feeling better in light of recent medication changes. We obtained collateral from patient's mother and boyfriend who reported that the patient seemed to be doing better during their interactions with her. The patient denied any side effects from medication. We will continue to monitor medication efficacy and side effects of lexapro taper and effexor. We will reach out to social work for dispo planning.  PLAN: Safety and Monitoring:  --  Voluntary admission to inpatient psychiatric unit for safety, stabilization and treatment  -- Daily contact with patient to assess and evaluate symptoms and progress in treatment  -- Patient's case to be discussed in multi-disciplinary team meeting  -- Observation Level : q15 minute checks  -- Vital signs:  q12 hours  -- Precautions: suicide, elopement, and assault  2. Interventions (medications, psychoeducation, etc):               -- medical regimen:   -- Increase Effexor-XR to 75 mg once daily at breakfast on 04/30/23 s/p starting Effexor-XR 37.5 mg  first dose at breakfast on 04/29/23   -- Continue propanolol 10 mg prn for panic attacks   -- Discontinue Lexapro 10 mg once daily   -- Discontinue Wellbutrin 150 mg daily  -- Patient does not need nicotine replacement  PRN medications for symptomatic management:              -- continue acetaminophen 650 mg every 6 hours as needed for mild to moderate pain, fever, and headaches              -- continue hydroxyzine 25 mg three times a day as needed for anxiety              -- continue trazodone 50 mg at bedtime as needed for insomnia   -- As needed agitation protocol in-place  The risks/benefits/side-effects/alternatives to the above medication were discussed in detail with the patient and time was given for questions. The patient consents to medication trial. FDA black box warnings, if present, were discussed.  The patient is agreeable with the medication plan, as above. We will monitor the patient's response to pharmacologic treatment, and adjust medications as necessary.  3. Routine and other pertinent labs:             -- Metabolic profile:  BMI: Body mass index is 25.51 kg/m.  Prolactin: No results found for: "PROLACTIN"  Lipid Panel: Lab Results  Component Value Date   CHOL 142 04/27/2023   TRIG 50 04/27/2023   HDL 55 04/27/2023   CHOLHDL 2.6 04/27/2023   VLDL 10 04/27/2023   LDLCALC 77 04/27/2023    HbgA1c: Hgb A1c  MFr Bld (%)  Date Value  04/27/2023 5.2    TSH: No results found for: "TSH"  EKG monitoring: QTc: 422  4. Group Therapy:  -- Encouraged patient to participate in unit milieu and in scheduled group therapies   -- Short Term Goals: Ability to identify changes in lifestyle to reduce recurrence of condition, verbalize feelings, identify and develop effective coping behaviors, maintain clinical measurements within normal limits, and identify triggers associated with substance abuse/mental health issues will improve. Improvement in ability to disclose and discuss suicidal ideas, demonstrate self-control, and comply with prescribed medications.  -- Long Term Goals: Improvement in symptoms so as ready for discharge -- Patient is encouraged to participate in group therapy while admitted to the psychiatric unit. -- We will address other chronic and acute stressors, which contributed to the patient's MDD (major depressive disorder), recurrent severe, without psychosis (HCC) in order to reduce the risk of self-harm at discharge.  5. Discharge Planning:   -- Social work and case management to assist with discharge planning and identification of hospital follow-up needs prior to discharge  -- Estimated LOS: 1-2 days  -- Discharge Concerns: Need to establish a safety plan; Medication compliance and effectiveness  -- Discharge Goals: Return home with outpatient referrals for mental health follow-up including medication management/psychotherapy  I certify that inpatient services furnished can reasonably be expected to improve the patient's condition.   Signed: Janith Lima, Medical Student 04/29/2023, 10:08 AM

## 2023-04-29 NOTE — BH IP Treatment Plan (Signed)
 Interdisciplinary Treatment and Diagnostic Plan Update  04/29/2023 Time of Session: 1025am Helen Howe MRN: [id number]  Principal Diagnosis: MDD (major depressive disorder), recurrent severe, without psychosis (HCC)  Secondary Diagnoses: Principal Problem:   MDD (major depressive disorder), recurrent severe, without psychosis (HCC) Active Problems:   Panic disorder   Current Medications:  Current Facility-Administered Medications  Medication Dose Route Frequency Provider Last Rate Last Admin   acetaminophen (TYLENOL) tablet 650 mg  650 mg Oral Q6H PRN Karie Fetch, MD   650 mg at 04/28/23 1631   alum & mag hydroxide-simeth (MAALOX/MYLANTA) 200-200-20 MG/5ML suspension 30 mL  30 mL Oral Q4H PRN Karie Fetch, MD       haloperidol (HALDOL) tablet 5 mg  5 mg Oral TID PRN Karie Fetch, MD       And   diphenhydrAMINE (BENADRYL) capsule 50 mg  50 mg Oral TID PRN Karie Fetch, MD       hydrOXYzine (ATARAX) tablet 25 mg  25 mg Oral TID PRN Karie Fetch, MD   25 mg at 04/28/23 2138   magnesium hydroxide (MILK OF MAGNESIA) suspension 30 mL  30 mL Oral Daily PRN Karie Fetch, MD       traZODone (DESYREL) tablet 50 mg  50 mg Oral QHS PRN Karie Fetch, MD   50 mg at 04/28/23 2138   [START ON 04/30/2023] venlafaxine XR (EFFEXOR-XR) 24 hr capsule 75 mg  75 mg Oral Q breakfast Abbott Pao, Nadir, MD       PTA Medications: Medications Prior to Admission  Medication Sig Dispense Refill Last Dose/Taking   escitalopram (LEXAPRO) 20 MG tablet Take 1 tablet (20 mg total) by mouth daily. 60 tablet 1 Past Week   buPROPion (WELLBUTRIN XL) 150 MG 24 hr tablet Take 1 tablet (150 mg total) by mouth daily.       Patient Stressors: Copy difficulties    Patient Strengths: Capable of independent living  Forensic psychologist fund of knowledge  Supportive family/friends   Treatment Modalities: Medication Management, Group therapy, Case management,  1  to 1 session with clinician, Psychoeducation, Recreational therapy.   Physician Treatment Plan for Primary Diagnosis: MDD (major depressive disorder), recurrent severe, without psychosis (HCC) Long Term Goal(s):     Short Term Goals:    Medication Management: Evaluate patient's response, side effects, and tolerance of medication regimen.  Therapeutic Interventions: 1 to 1 sessions, Unit Group sessions and Medication administration.  Evaluation of Outcomes: Not Progressing  Physician Treatment Plan for Secondary Diagnosis: Principal Problem:   MDD (major depressive disorder), recurrent severe, without psychosis (HCC) Active Problems:   Panic disorder  Long Term Goal(s):     Short Term Goals:       Medication Management: Evaluate patient's response, side effects, and tolerance of medication regimen.  Therapeutic Interventions: 1 to 1 sessions, Unit Group sessions and Medication administration.  Evaluation of Outcomes: Not Progressing   RN Treatment Plan for Primary Diagnosis: MDD (major depressive disorder), recurrent severe, without psychosis (HCC) Long Term Goal(s): Knowledge of disease and therapeutic regimen to maintain health will improve  Short Term Goals: Ability to remain free from injury will improve, Ability to verbalize frustration and anger appropriately will improve, Ability to demonstrate self-control, Ability to participate in decision making will improve, Ability to verbalize feelings will improve, Ability to disclose and discuss suicidal ideas, Ability to identify and develop effective coping behaviors will improve, and Compliance with prescribed medications will improve  Medication Management: RN will administer medications  as ordered by provider, will assess and evaluate patient's response and provide education to patient for prescribed medication. RN will report any adverse and/or side effects to prescribing provider.  Therapeutic Interventions: 1 on 1  counseling sessions, Psychoeducation, Medication administration, Evaluate responses to treatment, Monitor vital signs and CBGs as ordered, Perform/monitor CIWA, COWS, AIMS and Fall Risk screenings as ordered, Perform wound care treatments as ordered.  Evaluation of Outcomes: Not Progressing   LCSW Treatment Plan for Primary Diagnosis: MDD (major depressive disorder), recurrent severe, without psychosis (HCC) Long Term Goal(s): Safe transition to appropriate next level of care at discharge, Engage patient in therapeutic group addressing interpersonal concerns.  Short Term Goals: Engage patient in aftercare planning with referrals and resources, Increase social support, Increase ability to appropriately verbalize feelings, Increase emotional regulation, Facilitate acceptance of mental health diagnosis and concerns, Facilitate patient progression through stages of change regarding substance use diagnoses and concerns, and Identify triggers associated with mental health/substance abuse issues  Therapeutic Interventions: Assess for all discharge needs, 1 to 1 time with Social worker, Explore available resources and support systems, Assess for adequacy in community support network, Educate family and significant other(s) on suicide prevention, Complete Psychosocial Assessment, Interpersonal group therapy.  Evaluation of Outcomes: Not Progressing   Progress in Treatment: Attending groups: Yes. Participating in groups: Yes. Taking medication as prescribed: Yes. Toleration medication: Yes. Family/Significant other contact made: No, will contact:  Mike(boyfriend)[phone] Patient understands diagnosis: Yes. Discussing patient identified problems/goals with staff: Yes. Medical problems stabilized or resolved: Yes. Denies suicidal/homicidal ideation: Yes. Issues/concerns per patient self-inventory: No.  New problem(s) identified: No, Describe:  none reported  New Short Term/Long Term Goal(s):  medication stabilization, elimination of SI thoughts, development of comprehensive mental wellness plan.    Patient Goals:  "I need coping skills to help me stay grounded and learn my own triggers"  Discharge Plan or Barriers: Patient recently admitted. CSW will continue to follow and assess for appropriate referrals and possible discharge planning.    Reason for Continuation of Hospitalization: Anxiety Depression Medication stabilization Suicidal ideation  Estimated Length of Stay: 5-7 days  Last 3 [place] Suicide Severity Risk Score: Flowsheet Row Ad[facility] (Current[facility] Most recent reading at 04/27/2023  9:03 PM ED from 04/27/2023 in [facility] Most recent reading at 04/27/2023  5:04 PM  C-SSRS RISK CATEGORY Moderate Risk Moderate Risk       Last PHQ 2/9 Scores:     No data to display          Scribe for Treatment Team: Kathi Der, Theresia Majors 04/29/2023 12:58 PM

## 2023-04-29 NOTE — Group Note (Signed)
 Recreation Therapy Group Note   Group Topic:Other  Group Date: 04/29/2023 Start Time: 1405 End Time: 1450 Facilitators: Zaeden Lastinger-McCall, LRT,CTRS Location: [address] Dayroom   Activity Description/Intervention: Therapeutic Drumming. Patients with peers and staff were given the opportunity to engage in a leader facilitated [facility] Empowerment Drumming Circle with staff from the [employer], in partnership with [employer]. Teaching laboratory technician and trained [employer], Theodoro Doing leading with LRT observing and documenting intervention and pt response. This evidenced-based practice targets 7 areas of health and wellbeing in the human experience including: stress-reduction, exercise, self-expression, camaraderie/support, nurturing, spirituality, and music-making (leisure).   Goal Area(s) Addresses:  Patient will engage in pro-social way in music group.  Patient will follow directions of drum leader on the first prompt. Patient will demonstrate no behavioral issues during group.  Patient will identify if a reduction in stress level occurs as a result of participation in therapeutic drum circle.    Education: Leisure exposure, Pharmacologist, Musical expression, Discharge Planning   Affect/Mood: Appropriate   Participation Level: Engaged   Participation Quality: Independent   Behavior: Appropriate   Speech/Thought Process: Focused   Insight: Good   Judgement: Good   Modes of Intervention: Teaching laboratory technician   Patient Response to Interventions:  Engaged   Education Outcome:  In group clarification offered    Clinical Observations/Individualized Feedback: Helen Howe actively engaged in therapeutic drumming exercise and discussions. Pt was appropriate with peers, staff, and musical equipment for duration of programming.  Pt identified "relaxed" as their feeling after participation in music-based programming. Pt affect congruent/incongruent with  verbalized emotion.     Plan: Continue to engage patient in RT group sessions 2-3x/week.   Helen Howe, LRT,CTRS 04/29/2023 4:13 PM

## 2023-04-29 NOTE — Group Note (Signed)
 Date:  04/29/2023 Time:  9:37 AM  Group Topic/Focus:  Goals Group:   The focus of this group is to help patients establish daily goals to achieve during treatment and discuss how the patient can incorporate goal setting into their daily lives to aide in recovery. Orientation:   The focus of this group is to educate the patient on the purpose and policies of crisis stabilization and provide a format to answer questions about their admission.  The group details unit policies and expectations of patients while admitted.    Participation Level:  Active  Participation Quality:  Appropriate  Affect:  Appropriate  Cognitive:  Appropriate  Insight: Appropriate  Engagement in Group:  Engaged  Modes of Intervention:  Discussion  Additional Comments:    Pauline Trainer D Ngozi Alvidrez 04/29/2023, 9:37 AM

## 2023-04-30 DIAGNOSIS — F332 Major depressive disorder, recurrent severe without psychotic features: Secondary | ICD-10-CM | POA: Diagnosis not present

## 2023-04-30 NOTE — Progress Notes (Signed)
 [facility] Medical Student Progress Note  04/30/2023 9:18 AM Helen Howe  MRN:  [id number]  Principal Problem: MDD (major depressive disorder), recurrent severe, without psychosis (HCC) Diagnosis: Principal Problem:   MDD (major depressive disorder), recurrent severe, without psychosis (HCC) Active Problems:   Panic disorder   Reason for Admission:  Helen Howe is a 23 y.o. female with a past psychiatric history of MDD treated with lexapro, anxiety, and previous suicide attempt admitted voluntarily to the [facility] from behavioral health urgent care [facility]) for evaluation and management of worsening depression and intermittent, passive suicidal ideation. (admitted on 04/27/2023, total  LOS: 3 days )  Chart Review from last 24 hours:  The patient's chart was reviewed and nursing notes were reviewed. The patient's case was discussed in multidisciplinary team meeting.   - Overnight events to report per chart review / staff report: no notable overnight events to report - Patient received all scheduled medications - Patient received the following PRN medications: hydroxyzine and trazodone  Information Obtained Today During Patient Interview: The patient was seen and evaluated on the unit. On assessment today, the patient reported feeling good. She described her mood as "good." She reported that her day yesterday went a lot better compared to the day before. She reported spending most of her day attending her group sessions, spending time outdoors, socializing, and playing games. She reported feeling tired at the end of the day from socializing. She endorsed talking to her grandfather and boyfriend over the phone. She reported her mom visited her in the evening. She stated her anxiety and depression were both 0/10 (with 10 being the most severe). She denied SI, HI, and AVH. She reported receiving trazodone and hydroxyzine in the evening. She stated she slept well, sleeping for 8 hours.  Nursing reported 8 hours as well. This morning, she reported eating cereal and french toast sticks. She stated that her appetite is getting better and that she is hydrating. She endorsed a bowel movement yesterday evening. She reported her anxiety and depression as both 0/10. She denied SI, HI, and AVH in the morning. She stated that she felt a lot better since her medication changes. She reported that her "brain feels a lot more quiet" and the she is "not having racing thoughts." She denied any medication side effects. In terms of her plans moving forward, she reported making a list of goals to accomplish (including losing weight and spending time outdoors once a day), triggers (her academic advisor Theodoro Grist), and coping strategies (meditation, listing objects/activities that bring her calm and peace). She reported wanting to keep her current therapist, but wanted a referral from social work for an outpatient psychiatrist. She also reported talking to her mom about finding a primary care provider (likely their family physician).   Collateral information obtained   [name], patient's mom, [phone]) Patient granted permission to speak to contact person without restrictions.  Patient's mom reported that she noticed changes with patient's behavior about 1 year ago, but did not know what caused the shift in her behavior. In regards to recent hospitalization, patient's mom stated that the patient told her "not being here would be easier." The patient's mom reported that she did not think the patient was actively suicidal, but stated that the patient had verbalized passive suicidal thoughts. The patient's mom stated that she did not think the patient had a plan or intent, did not write a note (found no evidence at patient's house), and did not have an established timeline  or explanation. The patient's mom endorsed the patient communicating she did not want to do anything "to make her [the patient's mom]  sad." The patient's mom denied visiting patient because of her schedule but endorsed talking to her over the phone. She reported that the patient "sounded like herself" and "sounded most like herself in a long time." She reported the patient sounded a lot better. She denied having talked to the patient about plans upon discharge. She reported being open to the patient living with whomever. The patient's mother planned to visit often if patient was discharged to the residence with her boyfriend. For safety planning, the patient's mother denied having firearms in the house and denied stockpile of pills. The patient's mother reported looking forward to having a plan in place upon discharge for patient's care.  Florene Glen, patient's boyfriend, [phone]) Patient granted permission to speak to contact person without restrictions.  Patient's boyfriend reported that he noticed changes with the patient's behavior starting 11/2022. In regards to recent hospitalization, he stated that he thought the patient may have been very acutely suicidal prior to admission. He reported that the patient had a multi-tool in her hand, but did not use it. He denied the patient verbalizing any plan or intent, writing a note, or having an established timeline or explanation. He reported the patient had a history of suicidal thoughts since 11/2022, often related to feeling overwhelmed with school/schoolwork. He reported the patient had a history of trying to cut herself three times since January. He reported visiting the patient yesterday and stated that "she seems to be doing better" and "seems to be more like herself." He reported being open to the idea of the patient being discharged to their shared residence and denied any concerns. For safety planning, the patient's boyfriend denied having firearms in the house and denied stockpile of pills.     Past Psychiatric History:  Previous psych diagnoses:  Major Depressive Disorder,  diagnosed Sept. 2024 Prior inpatient psychiatric treatment: Denies Prior outpatient psychiatric treatment: Denies Current psychiatric provider:  Reports being seen by Dr. Shon Baton at [facility] for medications. Patient unsure if he is a psychiatrist.   Neuromodulation history: denies   Current therapist:  Sharlotte Alamo at [facility] Psychotherapy hx:  Sees above provider   History of suicide attempts:  Reports 1x prior attempt in Nov. 2024 by jumping out of a window, but was stopped by boyfriend History of homicide: Denies  Past medication trials: lexapro  Past Medical History: History reviewed. No pertinent past medical history.  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 Finances: no reliable source of income Legal: no Special educational needs teacher: never served Consulting civil engineer: denies owning any firearms Pills stockpile: denies  Current Medications: Current Facility-Administered Medications  Medication Dose Route Frequency Provider Last Rate Last Admin   acetaminophen (TYLENOL) tablet 650 mg  650 mg Oral Q6H PRN Karie Fetch, MD   650 mg at 04/28/23 1631   alum & mag hydroxide-simeth (MAALOX/MYLANTA) 200-200-20 MG/5ML suspension 30 mL  30 mL Oral Q4H PRN Karie Fetch, MD       haloperidol (HALDOL) tablet 5 mg  5 mg Oral TID PRN Karie Fetch, MD       And   diphenhydrAMINE (BENADRYL) capsule 50 mg  50 mg Oral TID PRN Karie Fetch, MD       hydrOXYzine (ATARAX) tablet 25 mg  25 mg Oral TID PRN Karie Fetch, MD   25 mg at 04/29/23 2124   magnesium hydroxide (MILK OF MAGNESIA) suspension 30 mL  30 mL Oral Daily PRN Karie Fetch, MD       traZODone (DESYREL) tablet 50 mg  50 mg Oral QHS PRN Karie Fetch, MD   50 mg at 04/29/23 2124   venlafaxine XR (EFFEXOR-XR) 24 hr capsule 75 mg  75 mg Oral Q breakfast Abbott Pao, Nadir, MD   75 mg at 04/30/23 6578    Lab Results:  Results for orders placed or performed during the hospital encounter of 04/27/23 (from the past 48 hours)  TSH     Status: None   Collection Time: 04/29/23  6:17 PM  Result Value Ref Range   TSH 0.475 0.350 - 4.500 uIU/mL    Comment: Performed by a 3rd Generation assay with a functional sensitivity of <=0.01 uIU/mL. Performed at [facility], [address] [place], [place] [address]     Blood Alcohol level:  Lab Results  Component Value Date   ETH <10 04/27/2023    Metabolic Labs: Lab Results  Component Value Date   HGBA1C 5.2 04/27/2023   MPG 102.54 04/27/2023   No results found for: "PROLACTIN" Lab Results  Component Value Date   CHOL 142 04/27/2023   TRIG 50 04/27/2023   HDL 55 04/27/2023   CHOLHDL 2.6 04/27/2023   VLDL 10 04/27/2023   LDLCALC 77 04/27/2023    Physical Findings: AIMS: No  CIWA:    COWS:     Psychiatric Specialty Exam: General Appearance: Casual   Eye Contact: Fair   Speech: Soft   Volume: Decreased   Mood: "Good"   Affect: Flat   Thought Content: WDL   Suicidal Thoughts: Patient denies suicidal thoughts  Homicidal Thoughts: Patient denies homicidal thoughts  Thought Process: Coherent   Orientation: Full (Time, Place and Person)     Memory: Immediate Fair   Judgment: Impaired   Insight: Lacking   Concentration: Fair   Recall: [employer] of Knowledge: Fair   Language: Fair   Psychomotor Activity: Minimal Psychomotor agitation  Assets:  Manufacturing systems engineer; Desire for Improvement; Housing; Resilience; Intimacy   Sleep: Well, Patient reported 8 hours   Review of Systems Review of Systems  Constitutional:  Negative for chills, fever and weight loss.  Eyes:  Negative for blurred vision and double vision.  Respiratory:  Negative for cough, shortness of breath and wheezing.   Cardiovascular:  Negative for chest pain and palpitations.  Gastrointestinal:  Negative for abdominal pain, constipation, diarrhea and vomiting.  Musculoskeletal:  Negative for myalgias.  Skin:  Negative for rash.  Neurological:  Negative for dizziness and headaches.  Psychiatric/Behavioral:  Positive for depression. Negative for hallucinations and suicidal ideas. The patient is nervous/anxious. The patient does not have insomnia.     Vital Signs: Blood pressure 124/74, pulse 80, temperature 98.7 F (37.1 C), temperature source Oral, resp. rate 18, height 5\' 3"  (1.6 m), weight 65.3 kg, last menstrual period 04/13/2023, SpO2 100%. Body mass index is 25.51 kg/m.  Physical Exam Constitutional:  Appearance: Normal appearance.  Eyes:     Conjunctiva/sclera: Conjunctivae normal.  Pulmonary:     Effort: Pulmonary effort is normal.  Musculoskeletal:        General: Normal range of motion.  Neurological:     Mental Status: She is alert and oriented to person, place, and time.  Psychiatric:        Thought Content: Thought content normal.     Assets  Assets: Communication Skills; Desire for Improvement; Housing; Resilience; Intimacy   Treatment Plan Summary: Daily contact with patient to assess and evaluate symptoms and progress in treatment and Medication management  Diagnoses / Active Problems: MDD (major depressive disorder), recurrent severe, without psychosis (HCC) Principal Problem:   MDD (major depressive disorder), recurrent severe, without psychosis (HCC) Active Problems:   Panic disorder   ASSESSMENT: Helen Howe is a 23  y.o. female with a past psychiatric history of MDD treated with lexapro, anxiety, and previous suicide attempt admitted voluntarily to the [facility] from behavioral health urgent care [facility]) for evaluation and management of worsening depression and intermittent, passive suicidal ideation.  On assessment, patient continues to require inpatient hospitalization for stabilization of mood symptoms in light of recent medication changes. On interview, we discussed the patient's anxiety and depression levels. The patient endorsed feeling "a lot better" after medication changes. She reported her brain felt a lot quieter with fewer racing thoughts. The patient denied any medication side effects. We also discussed the patient's plans moving forward including goals, triggers, and coping strategies. The patient reported having worked on these aspects of her care. Additionally, the patient's TSH levels resulted within normal limits. We will continue to monitor medication efficacy and side effects of Effexor. We will reach out to social work for dispo planning (outpatient psychiatry referral) with plan to discharge tomorrow.   PLAN: Safety and Monitoring:  -- Voluntary admission to inpatient psychiatric unit for safety, stabilization and treatment  -- Daily contact with patient to assess and evaluate symptoms and progress in treatment  -- Patient's case to be discussed in multi-disciplinary team meeting  -- Observation Level : q15 minute checks  -- Vital signs:  q12 hours  -- Precautions: suicide, elopement, and assault  2. Interventions (medications, psychoeducation, etc):               -- medical regimen: -- Continue Effexor-XR 75 mg once daily at breakfast s/p starting Effexor-XR 37.5 mg first dose at breakfast on 04/29/23   -- Continue propanolol 10 mg prn for panic attacks   -- Discontinue Lexapro 10 mg once daily   -- Discontinue Wellbutrin 150 mg daily  -- Patient does not need nicotine  replacement  PRN medications for symptomatic management:              -- continue acetaminophen 650 mg every 6 hours as needed for mild to moderate pain, fever, and headaches              -- continue hydroxyzine 25 mg three times a day as needed for anxiety              -- continue trazodone 50 mg at bedtime as needed for insomnia   -- As needed agitation protocol in-place  The risks/benefits/side-effects/alternatives to the above medication were discussed in detail with the patient and time was given for questions. The patient consents to medication trial. FDA black box warnings, if present, were discussed.  The patient is agreeable with the medication plan, as above. We  will monitor the patient's response to pharmacologic treatment, and adjust medications as necessary.  3. Routine and other pertinent labs:             -- Metabolic profile:  BMI: Body mass index is 25.51 kg/m.  Prolactin: No results found for: "PROLACTIN"  Lipid Panel: Lab Results  Component Value Date   CHOL 142 04/27/2023   TRIG 50 04/27/2023   HDL 55 04/27/2023   CHOLHDL 2.6 04/27/2023   VLDL 10 04/27/2023   LDLCALC 77 04/27/2023    HbgA1c: Hgb A1c MFr Bld (%)  Date Value  04/27/2023 5.2    TSH: TSH (uIU/mL)  Date Value  04/29/2023 0.475    EKG monitoring: QTc: 422  4. Group Therapy:  -- Encouraged patient to participate in unit milieu and in scheduled group therapies   -- Short Term Goals: Ability to identify changes in lifestyle to reduce recurrence of condition, verbalize feelings, identify and develop effective coping behaviors, maintain clinical measurements within normal limits, and identify triggers associated with substance abuse/mental health issues will improve. Improvement in ability to disclose and discuss suicidal ideas, demonstrate self-control, and comply with prescribed medications.  -- Long Term Goals: Improvement in symptoms so as ready for discharge -- Patient is encouraged to  participate in group therapy while admitted to the psychiatric unit. -- We will address other chronic and acute stressors, which contributed to the patient's MDD (major depressive disorder), recurrent severe, without psychosis (HCC) in order to reduce the risk of self-harm at discharge.  5. Discharge Planning:   -- Social work and case management to assist with discharge planning and identification of hospital follow-up needs prior to discharge  -- Estimated LOS: 1-2 days  -- Discharge Concerns: Need to establish a safety plan; Medication compliance and effectiveness  -- Discharge Goals: Return home with outpatient referrals for mental health follow-up including medication management/psychotherapy  I certify that inpatient services furnished can reasonably be expected to improve the patient's condition.   Signed: Janith Lima, Medical Student 04/30/2023, 9:18 AM

## 2023-04-30 NOTE — Group Note (Signed)
 LCSW Group Therapy Note   Group Date: 04/30/2023 Start Time: 1100 End Time: 1200  Participation:  patient was present  Type of Therapy:  Group Therapy  Title:  Healing From Within: Understanding Our Past, Building Our Future  Objective:  To help participants understand the impact of early experiences on mental and physical health, with a focus on Adverse Childhood Experiences (ACEs), and to explore ways to build resilience and healing.  Group Goals: Understand ACEs and Their Impact: Learn how childhood experiences shape mental and physical health. Build Resilience: Develop strategies for overcoming challenges and creating positive change. Promote Healing: Recognize the value of support and the possibility of healing through therapy and self-care.  Summary: In today's session, we discussed how early experiences, especially ACEs, impact mental and physical health. We explored the effects of stress, abuse, and neglect on brain development and well-being. The group focused on resilience, understanding that healing and positive change are possible with support and self-awareness.  Therapeutic Modalities Used: Psychoeducation: Sharing information about ACEs and their effects. Cognitive Behavioral Therapy (CBT): Helping reframe negative thought patterns. Trauma-Informed Therapy: Creating a safe, supportive space for healing.    Helen Howe Helen Howe, LCSWA 04/30/2023  6:01 PM

## 2023-04-30 NOTE — [facility] Group Notes (Signed)
 Adult Psychoeducational Group Note  Date:  04/30/2023 Time:  9:53 PM  Group Topic/Focus:  Wrap-Up Group:   The focus of this group is to help patients review their daily goal of treatment and discuss progress on daily workbooks.  Participation Level:  Active  Participation Quality:  Attentive  Affect:  Appropriate  Cognitive:  Alert  Insight: Appropriate  Engagement in Group:  Engaged  Modes of Intervention:  Discussion  Additional Comments:  Patient attended and participated in the Wrap-up group.  Jearl Klinefelter 04/30/2023, 9:53 PM

## 2023-04-30 NOTE — Progress Notes (Signed)
   04/30/23 0800  Psych Admission Type (Psych Patients Only)  Admission Status Voluntary  Psychosocial Assessment  Patient Complaints None  Eye Contact Fair  Facial Expression Flat;Sad  Affect Depressed  Speech Logical/coherent;Soft  Interaction Cautious  Motor Activity Other (Comment) (WNL)  Appearance/Hygiene Unremarkable  Behavior Characteristics Cooperative  Mood Depressed  Thought Process  Coherency WDL  Content WDL  Delusions None reported or observed  Perception WDL  Hallucination None reported or observed  Judgment Poor  Confusion None  Danger to Self  Current suicidal ideation? Denies  Self-Injurious Behavior No self-injurious ideation or behavior indicators observed or expressed   Agreement Not to Harm Self Yes  Description of Agreement verbal  Danger to Others  Danger to Others None reported or observed

## 2023-04-30 NOTE — [facility] Suicide Risk Assessment (Signed)
 [facility] INPATIENT:  Family/Significant Other Suicide Prevention Education  Suicide Prevention Education:  Education Completed; Kathlene November (boyfriend) [phone], (name of family member/significant other) has been identified by the patient as the family member/significant other with whom the patient will be residing, and identified as the person(s) who will aid the patient in the event of a mental health crisis (suicidal ideations/suicide attempt).  With written consent from the patient, the family member/significant other has been provided the following suicide prevention education, prior to the and/or following the discharge of the patient.  The suicide prevention education provided includes the following: Suicide risk factors Suicide prevention and interventions National Suicide Hotline telephone number [facility] assessment telephone number [facility] Emergency Assistance 911 [facility] and/or Residential Mobile Crisis Unit telephone number  Request made of family/significant other to: Remove weapons (e.g., guns, rifles, knives), all items previously/currently identified as safety concern.   Remove drugs/medications (over-the-counter, prescriptions, illicit drugs), all items previously/currently identified as a safety concern.  The family member/significant other verbalizes understanding of the suicide prevention education information provided.  The family member/significant other agrees to remove the items of safety concern listed above.  Helen Howe 04/30/2023, 12:50 PM

## 2023-04-30 NOTE — Plan of Care (Signed)
   Problem: Education: Goal: Emotional status will improve Outcome: Progressing Goal: Verbalization of understanding the information provided will improve Outcome: Progressing   Problem: Activity: Goal: Sleeping patterns will improve Outcome: Progressing   Problem: Coping: Goal: Ability to demonstrate self-control will improve Outcome: Progressing   Problem: Physical Regulation: Goal: Ability to maintain clinical measurements within normal limits will improve Outcome: Progressing   Problem: Safety: Goal: Periods of time without injury will increase Outcome: Progressing

## 2023-04-30 NOTE — Plan of Care (Signed)
  Problem: Health Behavior/Discharge Planning: Goal: Compliance with treatment plan for underlying cause of condition will improve Outcome: Progressing   Problem: Physical Regulation: Goal: Ability to maintain clinical measurements within normal limits will improve Outcome: Progressing   Problem: Safety: Goal: Periods of time without injury will increase Outcome: Progressing  Patient is soft spoken, flat and guarded compliant with treatment plan attended programming this shift denies SI/HI/A/VH and verbally contracts for safety. Requested for Hydroxyzine and Trazodone for sleep no adverse effects noted. Q 15 minutes safety checks ongoing Patient remains safe.

## 2023-05-01 DIAGNOSIS — F332 Major depressive disorder, recurrent severe without psychotic features: Secondary | ICD-10-CM

## 2023-05-01 MED ORDER — VENLAFAXINE HCL ER 150 MG PO CP24
150.0000 mg | ORAL_CAPSULE | Freq: Every day | ORAL | 0 refills | Status: DC
  Filled 2023-09-23 (×2): qty 90, 90d supply, fill #0

## 2023-05-01 MED ORDER — HYDROXYZINE HCL 25 MG PO TABS: 25.0000 mg | ORAL_TABLET | Freq: Three times a day (TID) | ORAL | 0 refills | Status: AC | PRN

## 2023-05-01 MED ORDER — TRAZODONE HCL 50 MG PO TABS: 50.0000 mg | ORAL_TABLET | Freq: Every evening | ORAL | 0 refills | Status: AC | PRN

## 2023-05-01 NOTE — BHH Suicide Risk Assessment (Signed)
 [facility] Discharge Suicide Risk Assessment   Principal Problem: MDD (major depressive disorder), recurrent severe, without psychosis (HCC) Discharge Diagnoses: Principal Problem:   MDD (major depressive disorder), recurrent severe, without psychosis (HCC) Active Problems:   Panic disorder   Total Time spent with patient: 45 minutes  Reason for admission: Helen Howe is a 23 y.o. female with a past psychiatric history of MDD treated with lexapro, anxiety, and previous suicide attempt admitted voluntarily to the [facility] from behavioral health urgent care [facility]) for evaluation and management of worsening depression and intermittent, passive suicidal ideation.   PTA Medications:         Medications Prior to Admission  Medication Sig Dispense Refill Last Dose/Taking   escitalopram (LEXAPRO) 20 MG tablet Take 1 tablet (20 mg total) by mouth daily. 60 tablet 1 Past Week   buPROPion (WELLBUTRIN XL) 150 MG 24 hr tablet Take 1 tablet (150 mg total) by mouth daily.           Hospital Course:  On admission, patient reports "needing a break from everything." Over the last few months, she reports a history of worsening depression. She states that the major stressor in her life is her performance at school. 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 She stated that she has not been performing well at school and that her mental health and new academic advisor Theodoro Grist have made it harder to continue and improve. She reports one of her goals is to rejoin the track team. Recently, she decided she wanted to take a break from school to manage her stress levels and decided to text her mom. During this conversation, the patient reports revealing her past suicidal thoughts and attempts to her mother, prompting her mother to bring her to the emergency department. She reports denying any suicidal ideation, plan or intent in the emergency department. She  reports denying homicidal ideation and auditory or visual hallucinations. She stated her anxiety and depression were both 10/10 (with 10 being the most severe) in the emergency department because of the chaotic environment. She confirms one past suicide attempt in Nov. 2024 where she tried to jump out of a window but was stopped by her boyfriend. She also endorses one additional instance of passive suicidal ideation without plan or intent in Dec. 2024. Additionally, she endorses one instance of self-harm a few days ago by rubbing a knife against the skin of her wrists, but denies intent to break the skin. This morning, patient reports sleep well overnight. She denies any suicidal ideation, homicidal ideation, and auditory and visual hallucinations. She reports her anxiety and depression as 3/10.  During the patient's hospitalization, patient had extensive initial psychiatric evaluation, and follow-up psychiatric evaluations every day.  Psychiatric diagnoses provided upon initial assessment: Major depressive disorder, recurrent episode, severe (HCC)   Patient's psychiatric medications were adjusted on admission: -- Taper Lexapro 20 mg to 10 mg once daily                         -- Start Effexor-XR 37.5 mg first dose at breakfast on 04/29/23, increasing to 75 mg once daily at breakfast on 04/30/23 after lexapro taper                         -- Start propanolol 10 mg prn for panic attacks                         --  Discontinue Wellbutrin 150 mg   During the hospitalization, other adjustments were made to the patient's psychiatric medication regimen: Lexapro was tapered down completely and discontinued with no issues, Effexor was titrated up to 75 mg daily to address depression and long-term treatment of anxiety, propranolol was discontinued as patient was using Atarax as needed for anxiety with good efficacy reported, trazodone was used nightly 50 mg as needed for sleep with good efficacy  reported.  Patient's care was discussed during the interdisciplinary team meeting every day during the hospitalization.  The patient denied having side effects to prescribed psychiatric medication.  Gradually, patient started adjusting to milieu. The patient was evaluated each day by a clinical provider to ascertain response to treatment. Improvement was noted by the patient's report of decreasing symptoms, improved sleep and appetite, affect, medication tolerance, behavior, and participation in unit programming.  Patient was asked each day to complete a self inventory noting mood, mental status, pain, new symptoms, anxiety and concerns.    Symptoms were reported as significantly decreased or resolved completely by discharge.   On day of discharge, patient was evaluated on 05/01/2023 the patient reports that their mood is stable. The patient denied having suicidal thoughts for more than 48 hours prior to discharge.  Patient denies having homicidal thoughts.  Patient denies having auditory hallucinations.  Patient denies any visual hallucinations or other symptoms of psychosis. The patient was motivated to continue taking medication with a goal of continued improvement in mental health.  On day of discharge patient presented futuristic and goal oriented, able to discuss coping skills with stressors as well as crisis plan if worsening mood or recurring SI anytime after discharge. The patient reports their target psychiatric symptoms of depression, anxiety, passive SI responded well to the psychiatric medications, and the patient reports overall benefit other psychiatric hospitalization. Supportive psychotherapy was provided to the patient. The patient also participated in regular group therapy while hospitalized. Coping skills, problem solving as well as relaxation therapies were also part of the unit programming.  Labs were reviewed with the patient, and abnormal results were discussed with the  patient.  The patient is able to verbalize their individual safety plan to this provider.  Behavioral Events: None  Restraints: None  Groups: Attended and participated  Medications Changes: As above  Sleep  Fair, improved during hospital stay  Musculoskeletal: Strength & Muscle Tone: within normal limits Gait & Station: normal Patient leans: N/A  Psychiatric Specialty Exam  General Appearance: appears at stated age, fairly dressed and groomed  Behavior: pleasant and cooperative  Psychomotor Activity:No psychomotor agitation or retardation noted   Eye Contact: good Speech: normal amount, tone, volume and latency   Mood: euthymic Affect: congruent, pleasant and interactive  Thought Process: linear, goal directed, no circumstantial or tangential thought process noted, no racing thoughts or flight of ideas Descriptions of Associations: intact Thought Content: Hallucinations: denies AH, VH , does not appear responding to stimuli Delusions: No paranoia or other delusions noted Suicidal Thoughts: denies SI, intention, plan  Homicidal Thoughts: denies HI, intention, plan   Alertness/Orientation: alert and fully oriented  Insight: fair, improved Judgment: fair, improved  Memory: intact  Executive Functions  Concentration: intact  Attention Span: Fair Recall: intact Fund of Knowledge: fair   Art therapist  Concentration: intact Attention Span: Fair Recall: intact Fund of Knowledge: fair   Assets  Assets: Manufacturing systems engineer; Desire for Improvement; Housing; Resilience; Intimacy   Physical Exam: Physical Exam ROS Blood pressure 134/74, pulse 86, temperature 98.8  F (37.1 C), temperature source Oral, resp. rate 14, height 5\' 3"  (1.6 m), weight 65.3 kg, last menstrual period 04/13/2023, SpO2 100%. Body mass index is 25.51 kg/m.  Mental Status Per Nursing Assessment::   On Admission:  NA  Demographic Factors:  Adolescent or young adult  Loss  Factors: NA  Historical Factors: Prior suicide attempts  Risk Reduction Factors:   Living with another person, especially a relative, Positive social support, Positive therapeutic relationship, and Positive coping skills or problem solving skills  Continued Clinical Symptoms: Symptoms of depression improved significantly during hospital stay Depression:   Anhedonia Impulsivity Insomnia  Cognitive Features That Contribute To Risk:  None    Suicide Risk:  Minimal: No identifiable suicidal ideation.  Patients presenting with no risk factors but with morbid ruminations; may be classified as minimal risk based on the severity of the depressive symptoms   Follow-up Information     [facility] Go on 05/26/2023.   Why: You have an appointment for medication management services on 05/26/23 at 11:00 am.   The appointment will be held in person. Contact information: [address] [place] [address] [phone]         Sharlotte Alamo, Counselor. Go on 05/04/2023.   Why: You have an appointment with your provider for therapy services on 05/04/23 at 1:00 pm, in person. Contact information: [facility] Building [address] [place], [place] [address]  P: [phone]                Plan Of Care/Follow-up recommendations:   Discharge recommendations:    Activity: as tolerated  Diet: heart healthy  # It is recommended to the patient to continue psychiatric medications as prescribed, after discharge from the hospital.     # It is recommended to the patient to follow up with your outpatient psychiatric provider and PCP.   # It was discussed with the patient, the impact of alcohol, drugs, tobacco have been there overall psychiatric and medical wellbeing, and total abstinence from substance use was recommended the patient.ed.   # Prescriptions provided or sent directly to preferred pharmacy at discharge. Patient agreeable to plan. Given  opportunity to ask questions. Appears to feel comfortable with discharge.    # In the event of worsening symptoms, the patient is instructed to call the crisis hotline, 911 and or go to the nearest ED for appropriate evaluation and treatment of symptoms. To follow-up with primary care provider for other medical issues, concerns and or health care needs   # Patient was discharged home with a plan to follow up as noted above.   Patient agrees with D/C instructions and plan.  The patient received suicide prevention pamphlet:  Yes Belongings returned:  Clothing and Valuables  Total Time Spent in Direct Patient Care:  I personally spent 45 minutes on the unit in direct patient care. The direct patient care time included face-to-face time with the patient, reviewing the patient's chart, communicating with other professionals, and coordinating care. Greater than 50% of this time was spent in counseling or coordinating care with the patient regarding goals of hospitalization, psycho-education, and discharge planning needs.   Izic Stfort Abbott Pao, MD 05/01/2023, 11:10 AM

## 2023-05-01 NOTE — Progress Notes (Signed)
  [facility] Adult Case Management Discharge Plan :  Will you be returning to the same living situation after discharge:  Yes,  pt will be returning home with boyfriend at discharge At discharge, do you have transportation home?: Yes,  pt will be picked up after 12 by boyfriend Do you have the ability to pay for your medications: Yes,  pt has BCBS active health insurance coverage  Release of information consent forms completed and in the chart;  Patient's signature needed at discharge.  Patient to Follow up at:  Follow-up Information     [facility] Go on 05/26/2023.   Why: You have an appointment for medication management services on 05/26/23 at 11:00 am.   The appointment will be held in person. Contact information: [address] [place] [address] [phone]         Sharlotte Alamo, Counselor. Go on 05/04/2023.   Why: You have an appointment with your provider for therapy services on 05/04/23 at 1:00 pm, in person. Contact information: [facility] Building [address] [place], [place] [address]  P: [phone]                Next level of care provider has access to [facility] Link:no  Safety Planning and Suicide Prevention discussed: Yes,  Kathlene November (boyfriend) [phone]    Has patient been referred to the Quitline?: Patient does not use tobacco/nicotine products  Patient has been referred for addiction treatment: No known substance use disorder.  Kathi Der, LCSWA 05/01/2023, 9:12 AM

## 2023-05-01 NOTE — Progress Notes (Signed)
   04/30/23 1942  Psych Admission Type (Psych Patients Only)  Admission Status Voluntary  Psychosocial Assessment  Patient Complaints Anxiety  Eye Contact Fair  Facial Expression Flat;Sad  Affect Depressed  Speech Logical/coherent  Interaction Cautious  Motor Activity Other (Comment)  Appearance/Hygiene Unremarkable  Behavior Characteristics Cooperative  Mood Depressed  Thought Process  Coherency WDL  Content WDL  Delusions None reported or observed  Perception WDL  Hallucination None reported or observed  Judgment Poor  Confusion None  Danger to Self  Current suicidal ideation? Denies  Self-Injurious Behavior No self-injurious ideation or behavior indicators observed or expressed   Agreement Not to Harm Self Yes  Description of Agreement verbal  Danger to Others  Danger to Others None reported or observed

## 2023-05-01 NOTE — Progress Notes (Signed)
   05/01/23 0900  Psych Admission Type (Psych Patients Only)  Admission Status Voluntary  Psychosocial Assessment  Patient Complaints None  Eye Contact Fair  Facial Expression Flat  Affect Appropriate to circumstance  Speech Logical/coherent  Interaction Cautious  Motor Activity Other (Comment)  Appearance/Hygiene Unremarkable  Behavior Characteristics Cooperative  Mood Pleasant  Thought Process  Coherency WDL  Content WDL  Delusions None reported or observed  Perception WDL  Hallucination None reported or observed  Judgment Poor  Confusion None  Danger to Self  Current suicidal ideation? Denies  Self-Injurious Behavior No self-injurious ideation or behavior indicators observed or expressed   Agreement Not to Harm Self Yes  Description of Agreement verbal  Danger to Others  Danger to Others None reported or observed

## 2023-05-01 NOTE — Plan of Care (Signed)
  Problem: Health Behavior/Discharge Planning: Goal: Compliance with treatment plan for underlying cause of condition will improve Outcome: Progressing   Problem: Physical Regulation: Goal: Ability to maintain clinical measurements within normal limits will improve Outcome: Progressing   Problem: Safety: Goal: Periods of time without injury will increase Outcome: Progressing   

## 2023-05-01 NOTE — Progress Notes (Signed)
Discharge Note:  Patient denies SI/HI/AVH at this time. Discharge instructions, AVS, prescriptions, and transition record gone over with patient. Patient agrees to comply with medication management, follow-up visit, and outpatient therapy. Patient belongings returned to patient. Patient questions and concerns addressed and answered. Patient ambulatory off unit. Patient discharged to home.

## 2023-05-01 NOTE — Group Note (Signed)
 Recreation Therapy Group Note   Group Topic:Problem Solving  Group Date: 05/01/2023 Start Time: 0930 End Time: 1000 Facilitators: Brionne Mertz-McCall, LRT,CTRS Location: [address] Dayroom   Group Topic: Communication, Team Building, Problem Solving  Goal Area(s) Addresses:  Patient will effectively work with peer towards shared goal.  Patient will identify skills used to make activity successful.  Patient will share challenges and verbalize solution-driven approaches used. Patient will identify how skills used during activity can be used to reach post d/c goals.   Intervention: STEM Activity   Activity: [employer]. Patients were provided the following materials: 5 drinking straws, 5 rubber bands, 5 paper clips, 2 index cards and 2 drinking cups. Using the provided materials patients were asked to build a launching mechanism to launch a ping pong ball across the room, approximately 10 feet. Patients were divided into teams of 3-5. Instructions required all materials be incorporated into the device, functionality of items left to the peer group's discretion.  Education: Pharmacist, community, Scientist, physiological, Air cabin crew, Building control surveyor.   Education Outcome: Acknowledges education/In group clarification offered/Needs additional education.    Affect/Mood: Appropriate   Participation Level: Engaged   Participation Quality: Independent   Behavior: Appropriate   Speech/Thought Process: Focused   Insight: Good   Judgement: Good   Modes of Intervention: STEM Activity   Patient Response to Interventions:  Engaged   Education Outcome:  In group clarification offered    Clinical Observations/Individualized Feedback: Pt was focused and was more of the leader and creator with her group. Pt was attentive to suggestions from peer. Pt was very creative with coming up with a structure that was different from previous attempts.     Plan: Continue to engage patient in RT group  sessions 2-3x/week.   Yanet Balliet-McCall, LRT,CTRS 05/01/2023 11:47 AM

## 2023-05-01 NOTE — Discharge Summary (Signed)
 Physician Discharge Summary Note  Patient:  Helen Howe is an 23 y.o., female MRN:  [id number] DOB:  [birth year] Patient phone:  [phone] (home)  Patient address:   [address] [place] [address],  Total Time spent with patient: 45 minutes  Date of Admission:  04/27/2023 Date of Discharge: 05/01/2023  Reason for Admission:   Helen Howe is a 23 y.o. female with a past psychiatric history of MDD treated with lexapro, anxiety, and previous suicide attempt admitted voluntarily to the [facility] from behavioral health urgent care [facility]) for evaluation and management of worsening depression and intermittent, passive suicidal ideation.   Principal Problem: MDD (major depressive disorder), recurrent severe, without psychosis (HCC) Discharge Diagnoses: Principal Problem:   MDD (major depressive disorder), recurrent severe, without psychosis (HCC) Active Problems:   Panic disorder   Past Psychiatric History:  Previous psych diagnoses:  Major Depressive Disorder, diagnosed Sept. 2024 Prior inpatient psychiatric treatment: Denies Prior outpatient psychiatric treatment: Denies Current psychiatric provider:  Reports being seen by Dr. Shon Baton at [facility] for medications. Patient unsure if he is a psychiatrist.   Neuromodulation history: denies   Current therapist:  Sharlotte Alamo at [facility] hx:  Sees above provider   History of suicide attempts:  Reports 1x prior attempt in Nov. 2024 by jumping out of a window, but was stopped by boyfriend History of homicide: Denies   Psychotropic medications: Current Lexapro, 20mg  daily - patient reportedly taking consistently, reports poor response and side effects including weight gain and reduced sexual drive Wellbutrin, 213 mg daily - recently prescribed to patient yesterday, but patient denies taking any   Past None reported   Past Medical History: History reviewed. No pertinent past medical history.  Past Surgical  History:  Procedure Laterality Date   CAST APPLICATION Right 05/30/2015   Procedure: CAST APPLICATION;  Surgeon: Erin Sons, MD;  Location: [facility] ORS;  Service: Orthopedics;  Laterality: Right;   CLOSED REDUCTION WRIST FRACTURE Right 05/30/2015   Procedure: CLOSED REDUCTION WRIST / MANIPULATION;  Surgeon: Erin Sons, MD;  Location: [facility] ORS;  Service: Orthopedics;  Laterality: Right;   CLOSED REDUCTION WRIST FRACTURE      Family History:  Family History  Problem Relation Age of Onset   Depression Mother    Schizophrenia Sister    Family Psychiatric  History:  Psych: Mother - history of depression; Older sister - history of schizophrenia; Younger brother - history of ADHD Psych Rx: patient reports not knowing family's medication status Suicide: denies Homicide: denies Substance use family hx: denies Social History:  Social History   Substance and Sexual Activity  Alcohol Use No     Social History   Substance and Sexual Activity  Drug Use No    Social History   Socioeconomic History   Marital status: Single    Spouse name: Not on file   Number of children: Not on file   Years of education: Not on file   Highest education level: Not on file  Occupational History   Not on file  Tobacco Use   Smoking status: Never   Smokeless tobacco: Never  Vaping Use   Vaping status: Never Used  Substance and Sexual Activity   Alcohol use: No   Drug use: No   Sexual activity: Not Currently  Other Topics Concern   Not on file  Social History Narrative   Not on file   Social Drivers of Health   Financial Resource Strain: Not on file  Food  Insecurity: No Food Insecurity (04/28/2023)   Hunger Vital Sign    Worried About Running Out of Food in the Last Year: Never true    Ran Out of Food in the Last Year: Never true  Transportation Needs: No Transportation Needs (04/28/2023)   PRAPARE - Administrator, Civil Service (Medical): No    Lack of Transportation  (Non-Medical): No  Physical Activity: Not on file  Stress: Not on file  Social Connections: Not on file   Place of birth and grew up where: patient grew up in [place], [place]; moved to [place] in 2015 Abuse: no history of abuse Marital Status: partnered Sexual orientation: undetermined Children: Denies Employment: unemployed 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 Finances: no reliable source of income Legal: no Special educational needs teacher: never served Consulting civil engineer: denies owning any firearms Pills stockpile: denies  Substance Use History:  Alcohol:  Drinks 1 mixed drink on special occasions Hx withdrawal tremors/shakes: denies Hx alcohol related blackouts: denies Hx alcohol induced hallucinations: denies Hx alcoholic seizures: denies Hx medical hospitalization due to severe alcohol withdrawal symptoms: denies DUI: denies   --------   Tobacco: denies Cannabis (marijuana): denies Cocaine: denies Methamphetamines: denies Psilocybin (mushrooms): denies Ecstasy (MDMA / molly): denies LSD (acid): denies Opiates (fentanyl / heroin): denies Benzos (Xanax, Klonopin): denies IV drug use: denies Prescribed meds abuse: denies   History of detox: denies History of rehab: denies  Hospital Course:   On admission, patient reports "needing a break from everything." Over the last few months, she reports a history of worsening depression. She states that the major stressor in her life is her performance at school. 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 She stated that she has not been performing well at school and that her mental health and new academic advisor Theodoro Grist have made it harder to continue and improve. She reports one of her goals is to rejoin the track team. Recently, she decided she wanted to take a break from school to manage her stress levels and decided to text her  mom. During this conversation, the patient reports revealing her past suicidal thoughts and attempts to her mother, prompting her mother to bring her to the emergency department. She reports denying any suicidal ideation, plan or intent in the emergency department. She reports denying homicidal ideation and auditory or visual hallucinations. She stated her anxiety and depression were both 10/10 (with 10 being the most severe) in the emergency department because of the chaotic environment. She confirms one past suicide attempt in Nov. 2024 where she tried to jump out of a window but was stopped by her boyfriend. She also endorses one additional instance of passive suicidal ideation without plan or intent in Dec. 2024. Additionally, she endorses one instance of self-harm a few days ago by rubbing a knife against the skin of her wrists, but denies intent to break the skin. This morning, patient reports sleep well overnight. She denies any suicidal ideation, homicidal ideation, and auditory and visual hallucinations. She reports her anxiety and depression as 3/10.  During the patient's hospitalization, patient had extensive initial psychiatric evaluation, and follow-up psychiatric evaluations every day.   Psychiatric diagnoses provided upon initial assessment: Major depressive disorder, recurrent episode, severe (HCC)    Patient's psychiatric medications were adjusted on admission: -- Taper Lexapro 20 mg to 10 mg once daily                         --  Start Effexor-XR 37.5 mg first dose at breakfast on 04/29/23, increasing to 75 mg once daily at breakfast on 04/30/23 after lexapro taper                         -- Start propanolol 10 mg prn for panic attacks                         -- Discontinue Wellbutrin 150 mg    During the hospitalization, other adjustments were made to the patient's psychiatric medication regimen: Lexapro was tapered down completely and discontinued with no issues, Effexor was titrated up  to 75 mg daily to address depression and long-term treatment of anxiety, propranolol was discontinued as patient was using Atarax as needed for anxiety with good efficacy reported, trazodone was used nightly 50 mg as needed for sleep with good efficacy reported.   Patient's care was discussed during the interdisciplinary team meeting every day during the hospitalization.   The patient denied having side effects to prescribed psychiatric medication.   Gradually, patient started adjusting to milieu. The patient was evaluated each day by a clinical provider to ascertain response to treatment. Improvement was noted by the patient's report of decreasing symptoms, improved sleep and appetite, affect, medication tolerance, behavior, and participation in unit programming.  Patient was asked each day to complete a self inventory noting mood, mental status, pain, new symptoms, anxiety and concerns.     Symptoms were reported as significantly decreased or resolved completely by discharge.    On day of discharge, patient was evaluated on 05/01/2023 the patient reports that their mood is stable. The patient denied having suicidal thoughts for more than 48 hours prior to discharge.  Patient denies having homicidal thoughts.  Patient denies having auditory hallucinations.  Patient denies any visual hallucinations or other symptoms of psychosis. The patient was motivated to continue taking medication with a goal of continued improvement in mental health.  On day of discharge patient presented futuristic and goal oriented, able to discuss coping skills with stressors as well as crisis plan if worsening mood or recurring SI anytime after discharge. The patient reports their target psychiatric symptoms of depression, anxiety, passive SI responded well to the psychiatric medications, and the patient reports overall benefit other psychiatric hospitalization. Supportive psychotherapy was provided to the patient. The patient  also participated in regular group therapy while hospitalized. Coping skills, problem solving as well as relaxation therapies were also part of the unit programming.   Labs were reviewed with the patient, and abnormal results were discussed with the patient.   The patient is able to verbalize their individual safety plan to this provider.   Behavioral Events: None   Restraints: None   Groups: Attended and participated   Medications Changes: As above   Sleep  Fair, improved during hospital stay  Physical Findings: AIMS:  , ,  ,  ,    CIWA:    COWS:     Musculoskeletal: Strength & Muscle Tone: within normal limits Gait & Station: normal Patient leans: N/A   Psychiatric Specialty Exam:  General Appearance: appears at stated age, fairly dressed and groomed  Behavior: pleasant and cooperative  Psychomotor Activity:No psychomotor agitation or retardation noted   Eye Contact: good Speech: normal amount, tone, volume and latency   Mood: euthymic Affect: congruent, pleasant and interactive  Thought Process: linear, goal directed, no circumstantial or tangential thought process noted, no racing thoughts  or flight of ideas Descriptions of Associations: intact Thought Content: Hallucinations: denies AH, VH , does not appear responding to stimuli Delusions: No paranoia or other delusions noted Suicidal Thoughts: denies SI, intention, plan  Homicidal Thoughts: denies HI, intention, plan   Alertness/Orientation: alert and fully oriented  Insight: fair, improved Judgment: fair, improved  Memory: intact  Executive Functions  Concentration: intact  Attention Span: Fair Recall: intact Fund of Knowledge: fair   Assets  Assets: Manufacturing systems engineer; Desire for Improvement; Housing; Resilience; Intimacy  Physical Exam:  Physical Exam Vitals and nursing note reviewed.  Constitutional:      Appearance: Normal appearance. She is normal weight.  HENT:     Head:  Normocephalic and atraumatic.     Nose: Nose normal.  Eyes:     Extraocular Movements: Extraocular movements intact.  Pulmonary:     Effort: Pulmonary effort is normal.  Musculoskeletal:        General: Normal range of motion.     Cervical back: Normal range of motion.  Neurological:     General: No focal deficit present.     Mental Status: She is alert and oriented to person, place, and time. Mental status is at baseline.  Psychiatric:        Mood and Affect: Mood normal.        Behavior: Behavior normal.        Thought Content: Thought content normal.    Review of Systems  All other systems reviewed and are negative.  Blood pressure 134/74, pulse 86, temperature 98.8 F (37.1 C), temperature source Oral, resp. rate 14, height 5\' 3"  (1.6 m), weight 65.3 kg, last menstrual period 04/13/2023, SpO2 100%. Body mass index is 25.51 kg/m.   Social History   Tobacco Use  Smoking Status Never  Smokeless Tobacco Never   Tobacco Cessation:  N/A, patient does not currently use tobacco products   Blood Alcohol level:  Lab Results  Component Value Date   ETH <10 04/27/2023    Metabolic Disorder Labs:  Lab Results  Component Value Date   HGBA1C 5.2 04/27/2023   MPG 102.54 04/27/2023   No results found for: "PROLACTIN" Lab Results  Component Value Date   CHOL 142 04/27/2023   TRIG 50 04/27/2023   HDL 55 04/27/2023   CHOLHDL 2.6 04/27/2023   VLDL 10 04/27/2023   LDLCALC 77 04/27/2023    See Psychiatric Specialty Exam and Suicide Risk Assessment completed by Attending Physician prior to discharge.  Discharge destination:  Home with boyfriend  Is patient on multiple antipsychotic therapies at discharge:  No   Has Patient had three or more failed trials of antipsychotic monotherapy by history:  No  Recommended Plan for Multiple Antipsychotic Therapies: NA  Discharge Instructions     Diet - low sodium heart healthy   Complete by: As directed    Increase activity  slowly   Complete by: As directed       Allergies as of 05/01/2023   No Known Allergies      Medication List     STOP taking these medications    buPROPion 150 MG 24 hr tablet Commonly known as: WELLBUTRIN XL   escitalopram 20 MG tablet Commonly known as: LEXAPRO       TAKE these medications      Indication  hydrOXYzine 25 MG tablet Commonly known as: ATARAX Take 1 tablet (25 mg total) by mouth 3 (three) times daily as needed for anxiety.  Indication: Feeling Anxious  traZODone 50 MG tablet Commonly known as: DESYREL Take 1 tablet (50 mg total) by mouth at bedtime as needed for sleep.  Indication: Anxiety Disorder   venlafaxine XR 75 MG 24 hr capsule Commonly known as: EFFEXOR-XR Take 1 capsule (75 mg total) by mouth daily with breakfast.  Indication: Major Depressive Disorder        Follow-up Information     [facility] Go on 05/26/2023.   Why: You have an appointment for medication management services on 05/26/23 at 11:00 am.   The appointment will be held in person. Contact information: [address] [place] [address] [phone]         Sharlotte Alamo, Counselor. Go on 05/04/2023.   Why: You have an appointment with your provider for therapy services on 05/04/23 at 1:00 pm, in person. Contact information: [facility] [address] [place], [place] [address]  P: [phone]                Discharge recommendations:   Activity: as tolerated  Diet: heart healthy  # It is recommended to the patient to continue psychiatric medications as prescribed, after discharge from the hospital.     # It is recommended to the patient to follow up with your outpatient psychiatric provider and PCP.   # It was discussed with the patient, the impact of alcohol, drugs, tobacco have been there overall psychiatric and medical wellbeing, and total abstinence from substance use was recommended the patient.ed.   #  Prescriptions provided or sent directly to preferred pharmacy at discharge. Patient agreeable to plan. Given opportunity to ask questions. Appears to feel comfortable with discharge.    # In the event of worsening symptoms, the patient is instructed to call the crisis hotline, 911 and or go to the nearest ED for appropriate evaluation and treatment of symptoms. To follow-up with primary care provider for other medical issues, concerns and or health care needs   # Patient was discharged home with a plan to follow up as noted above.   Patient agrees with D/C instructions and plan.   The patient received suicide prevention pamphlet:  Yes Belongings returned:  Clothing and Valuables  Total Time Spent in Direct Patient Care:  I personally spent 45 minutes on the unit in direct patient care. The direct patient care time included face-to-face time with the patient, reviewing the patient's chart, communicating with other professionals, and coordinating care. Greater than 50% of this time was spent in counseling or coordinating care with the patient regarding goals of hospitalization, psycho-education, and discharge planning needs.    SignedSarita Bottom, MD 05/01/2023, 11:17 AM

## 2023-06-01 DIAGNOSIS — Z5321 Procedure and treatment not carried out due to patient leaving prior to being seen by health care provider: Secondary | ICD-10-CM | POA: Diagnosis not present

## 2023-06-01 DIAGNOSIS — R1031 Right lower quadrant pain: Secondary | ICD-10-CM | POA: Insufficient documentation

## 2023-06-02 ENCOUNTER — Other Ambulatory Visit: Payer: Self-pay

## 2023-06-02 ENCOUNTER — Emergency Department (HOSPITAL_COMMUNITY)
Admission: EM | Admit: 2023-06-02 | Discharge: 2023-06-02 | Discharge status: Against Medical Advice | Attending: Emergency Medicine | Admitting: Emergency Medicine

## 2023-06-02 LAB — COMPREHENSIVE METABOLIC PANEL WITH GFR
ALT: 13 U/L (ref 0–44)
AST: 19 U/L (ref 15–41)
Albumin: 4.1 g/dL (ref 3.5–5.0)
Alkaline Phosphatase: 25 U/L — ABNORMAL LOW (ref 38–126)
Anion gap: 11 (ref 5–15)
BUN: 9 mg/dL (ref 6–20)
CO2: 16 mmol/L — ABNORMAL LOW (ref 22–32)
Calcium: 8.9 mg/dL (ref 8.9–10.3)
Chloride: 104 mmol/L (ref 98–111)
Creatinine, Ser: 0.81 mg/dL (ref 0.44–1.00)
GFR, Estimated: >60 mL/min (ref >60)
Glucose, Bld: 139 mg/dL — ABNORMAL HIGH (ref 70–99)
Potassium: 3 mmol/L — ABNORMAL LOW (ref 3.5–5.1)
Sodium: 131 mmol/L — ABNORMAL LOW (ref 135–145)
Total Bilirubin: 0.6 mg/dL (ref 0.0–1.2)
Total Protein: 7.6 g/dL (ref 6.5–8.1)

## 2023-06-02 LAB — CBC
HCT: 37.9 % (ref 36.0–46.0)
Hemoglobin: 12.5 g/dL (ref 12.0–15.0)
MCH: 26.8 pg (ref 26.0–34.0)
MCHC: 33 g/dL (ref 30.0–36.0)
MCV: 81.2 fL (ref 80.0–100.0)
Platelets: 287 10*3/uL (ref 150–400)
RBC: 4.67 MIL/uL (ref 3.87–5.11)
RDW: 12.3 % (ref 11.5–15.5)
WBC: 9.7 10*3/uL (ref 4.0–10.5)
nRBC: 0 % (ref 0.0–0.2)

## 2023-06-02 LAB — HCG, SERUM, QUALITATIVE: Preg, Serum: NEGATIVE

## 2023-06-02 LAB — LIPASE, BLOOD: Lipase: 36 U/L (ref 11–51)

## 2023-06-02 NOTE — ED Triage Notes (Signed)
 Pt presents with stabbing pain lower right side of abd. Started this morning. Gradually worsening throughout the day. Pain radiating down right leg.

## 2023-09-23 ENCOUNTER — Other Ambulatory Visit (HOSPITAL_COMMUNITY): Payer: Self-pay

## 2023-09-23 ENCOUNTER — Other Ambulatory Visit: Payer: Self-pay

## 2023-12-14 ENCOUNTER — Encounter: Payer: Self-pay | Admitting: Radiology

## 2023-12-15 ENCOUNTER — Other Ambulatory Visit (HOSPITAL_COMMUNITY): Payer: Self-pay

## 2023-12-21 ENCOUNTER — Other Ambulatory Visit (HOSPITAL_COMMUNITY): Payer: Self-pay

## 2023-12-23 ENCOUNTER — Other Ambulatory Visit (HOSPITAL_COMMUNITY): Payer: Self-pay

## 2023-12-25 ENCOUNTER — Other Ambulatory Visit (HOSPITAL_COMMUNITY): Payer: Self-pay

## 2023-12-28 ENCOUNTER — Encounter: Payer: Self-pay | Admitting: *Deleted

## 2023-12-28 ENCOUNTER — Other Ambulatory Visit: Payer: Self-pay

## 2023-12-28 DIAGNOSIS — Z760 Encounter for issue of repeat prescription: Secondary | ICD-10-CM | POA: Insufficient documentation

## 2023-12-28 NOTE — ED Triage Notes (Signed)
 Pt ambulatory to triage.  Pt is out of effexor  for 4 days.  Pt requesting a refill.  Pt alert.

## 2023-12-29 ENCOUNTER — Emergency Department
Admission: EM | Admit: 2023-12-29 | Discharge: 2023-12-29 | Disposition: A | Discharge status: Home / Self Care | Payer: Self-pay

## 2023-12-29 DIAGNOSIS — Z760 Encounter for issue of repeat prescription: Secondary | ICD-10-CM

## 2023-12-29 MED ORDER — VENLAFAXINE HCL ER 150 MG PO CP24: 150.0000 mg | ORAL_CAPSULE | Freq: Every day | ORAL | 0 refills | Status: AC

## 2023-12-29 MED ORDER — VENLAFAXINE HCL ER 150 MG PO CP24
150.0000 mg | ORAL_CAPSULE | Freq: Once | ORAL | Status: AC
  Administered 2023-12-29: 150 mg via ORAL
  Filled 2023-12-29: qty 1

## 2023-12-29 MED ORDER — ONDANSETRON 4 MG PO TBDP
4.0000 mg | ORAL_TABLET | Freq: Once | ORAL | Status: AC
  Administered 2023-12-29: 4 mg via ORAL
  Filled 2023-12-29: qty 1

## 2023-12-29 MED ORDER — VENLAFAXINE HCL ER 75 MG PO CP24: 150.0000 mg | ORAL_CAPSULE | Freq: Every day | ORAL | 0 refills | Status: DC

## 2023-12-29 NOTE — ED Notes (Signed)
 ED Provider at bedside.

## 2023-12-29 NOTE — ED Provider Notes (Addendum)
 [facility] Provider Note    Event Date/Time   First MD Initiated Contact with Patient 12/29/23 0029     (approximate)   History   Medication Refill  Pt ambulatory to triage.  Pt is out of effexor  for 4 days.  Pt requesting a refill.  Pt alert.     HPI Helen Howe is a 23 y.o. female PMH depression presents for medication refill -Patient states she ran out of her Effexor  about 4 days ago.  Has been feeling mild vague discomfort with some mild nausea.  No fevers, abdominal pain, urinary symptoms, URI symptoms.  Has otherwise been in her usual state of health.  Says she usually feels like this is when she runs out of her medication. -Does have a follow-up with her psychiatrist this coming Saturday per patient report for ongoing medication management - No SI, HI, hallucinations  Per chart review, appears patient was seen in urgent care clinic on 12/25/2023.  It appears she was prescribed a 4-day course of Effexor  though patient states she was told they could not refill this so did not go to a pharmacy.  Also per chart review, patient was last admitted in March of this year for MDD, intermittent passive suicidal ideation.  Effexor  was started at that time.  Wellbutrin  discontinued.  Lexapro  tapered.  Appears Lexapro  was refilled and July of this year at a dose of 150 mg daily.     Physical Exam   Triage Vital Signs: ED Triage Vitals  Encounter Vitals Group     BP 12/28/23 2213 105/89     Girls Systolic BP Percentile --      Girls Diastolic BP Percentile --      Boys Systolic BP Percentile --      Boys Diastolic BP Percentile --      Pulse Rate 12/28/23 2212 86     Resp 12/28/23 2212 18     Temp 12/28/23 2212 97.9 F (36.6 C)     Temp Source 12/28/23 2212 Oral     SpO2 12/28/23 2212 100 %     Weight 12/28/23 2213 135 lb (61.2 kg)     Height 12/28/23 2213 5' 3 (1.6 m)     Head Circumference --      Peak Flow --      Pain Score 12/28/23 2212 0      Pain Loc --      Pain Education --      Exclude from Growth Chart --     Most recent vital signs: Vitals:   12/28/23 2212 12/28/23 2213  BP:  105/89  Pulse: 86   Resp: 18   Temp: 97.9 F (36.6 C)   SpO2: 100%      General: Awake, no distress.  CV:  Warm, good peripheral perfusion Resp:  Normal effort Abd:  No distention. Nontender to deep palpation throughout Psych:  Calm, cooperative, pleasant.  Denies SI, HI, hallucinations.  Linear.   ED Results / Procedures / Treatments   Labs (all labs ordered are listed, but only abnormal results are displayed) Labs Reviewed - No data to display   EKG  N/a   RADIOLOGY N/a    PROCEDURES:  Critical Care performed: No  Procedures   MEDICATIONS ORDERED IN ED: Medications  venlafaxine  XR (EFFEXOR -XR) 24 hr capsule 150 mg (has no administration in time range)  ondansetron  (ZOFRAN -ODT) disintegrating tablet 4 mg (has no administration in time range)     IMPRESSION / MDM /  ASSESSMENT AND PLAN / ED COURSE  I reviewed the triage vital signs and the nursing notes.                              DDX/MDM/AP: Differential diagnosis includes, but is not limited to, medication refill.  Suspect mild symptoms of medication withdrawal of-no clinical concern for acute underlying medical pathology at this time including intra-abdominal pathology, UTI, viral syndrome.  Will provide short course of medication refill and give first dose here in emergency department.  No evidence of psychiatric decompensation at this time.  Plan: - venlaxafine, Zofran  (EKG from March of this year reviewed, normal Qtc)   Patient's presentation is most consistent with acute, uncomplicated illness.   ED course below.  Given her usual dose of Lexapro  here as well as single dose of Zofran .  Provided 14-day refill for Lexapro  with plan to follow-up with her psychiatrist later this week.  No evidence of psychiatric compensation.  ED return precautions in  place.  Patient agrees with plan.      FINAL CLINICAL IMPRESSION(S) / ED DIAGNOSES   Final diagnoses:  Medication refill     Rx / DC Orders   ED Discharge Orders          Ordered    venlafaxine  XR (EFFEXOR  XR) 150 MG 24 hr capsule  Daily with breakfast        12/29/23 0046    venlafaxine  XR (EFFEXOR  XR) 75 MG 24 hr capsule  Daily,   Status:  Discontinued        12/29/23 0036             Note:  This document was prepared using Dragon voice recognition software and may include unintentional dictation errors.   Clarine Ozell LABOR, MD 12/29/23 0045    Clarine Ozell LABOR, MD 12/29/23 [phone]

## 2023-12-29 NOTE — Discharge Instructions (Addendum)
 Your evaluation in the emergency department was reassuring.  I refilled your Effexor , and you should continue to follow-up with your psychiatrist later this week as already planned.  Return to the emergency department with any new or worsening symptoms.
